# Patient Record
Sex: Female | Born: 1960 | Race: Black or African American | Hispanic: No | Marital: Single | Smoking: Current every day smoker
Health system: Southern US, Community
[De-identification: ages and names within clinical notes are randomized; demographics above are authoritative.]

## PROBLEM LIST (undated history)

## (undated) DIAGNOSIS — Z86018 Personal history of other benign neoplasm: Secondary | ICD-10-CM

## (undated) DIAGNOSIS — D649 Anemia, unspecified: Secondary | ICD-10-CM

## (undated) DIAGNOSIS — K5900 Constipation, unspecified: Secondary | ICD-10-CM

## (undated) DIAGNOSIS — J40 Bronchitis, not specified as acute or chronic: Secondary | ICD-10-CM

## (undated) DIAGNOSIS — N736 Female pelvic peritoneal adhesions (postinfective): Secondary | ICD-10-CM

## (undated) DIAGNOSIS — N83209 Unspecified ovarian cyst, unspecified side: Secondary | ICD-10-CM

## (undated) DIAGNOSIS — J329 Chronic sinusitis, unspecified: Secondary | ICD-10-CM

## (undated) HISTORY — DX: Female pelvic peritoneal adhesions (postinfective): N73.6

## (undated) HISTORY — PX: BLADDER SUSPENSION: SHX72

## (undated) HISTORY — PX: SUPRACERVICAL ABDOMINAL HYSTERECTOMY: SHX5393

---

## 2014-10-04 ENCOUNTER — Emergency Department (HOSPITAL_BASED_OUTPATIENT_CLINIC_OR_DEPARTMENT_OTHER): Payer: BLUE CROSS/BLUE SHIELD

## 2014-10-04 ENCOUNTER — Encounter (HOSPITAL_BASED_OUTPATIENT_CLINIC_OR_DEPARTMENT_OTHER): Payer: Self-pay | Admitting: *Deleted

## 2014-10-04 ENCOUNTER — Emergency Department (HOSPITAL_BASED_OUTPATIENT_CLINIC_OR_DEPARTMENT_OTHER)
Admission: EM | Admit: 2014-10-04 | Discharge: 2014-10-04 | Disposition: A | Discharge status: Home / Self Care | Payer: BLUE CROSS/BLUE SHIELD | Attending: Emergency Medicine | Admitting: Emergency Medicine

## 2014-10-04 DIAGNOSIS — Z720 Tobacco use: Secondary | ICD-10-CM | POA: Diagnosis not present

## 2014-10-04 DIAGNOSIS — J40 Bronchitis, not specified as acute or chronic: Secondary | ICD-10-CM | POA: Diagnosis not present

## 2014-10-04 DIAGNOSIS — Z880 Allergy status to penicillin: Secondary | ICD-10-CM | POA: Insufficient documentation

## 2014-10-04 DIAGNOSIS — R0602 Shortness of breath: Secondary | ICD-10-CM | POA: Diagnosis present

## 2014-10-04 HISTORY — DX: Bronchitis, not specified as acute or chronic: J40

## 2014-10-04 HISTORY — DX: Chronic sinusitis, unspecified: J32.9

## 2014-10-04 LAB — BASIC METABOLIC PANEL
Anion gap: 9 (ref 5–15)
BUN: 9 mg/dL (ref 6–23)
CHLORIDE: 108 mmol/L (ref 96–112)
CO2: 23 mmol/L (ref 19–32)
Calcium: 8.9 mg/dL (ref 8.4–10.5)
Creatinine, Ser: 0.8 mg/dL (ref 0.50–1.10)
GFR, EST NON AFRICAN AMERICAN: 82 mL/min — AB (ref >90)
Glucose, Bld: 113 mg/dL — ABNORMAL HIGH (ref 70–99)
Potassium: 3.1 mmol/L — ABNORMAL LOW (ref 3.5–5.1)
Sodium: 140 mmol/L (ref 135–145)

## 2014-10-04 LAB — CBC WITH DIFFERENTIAL/PLATELET
BASOS ABS: 0 10*3/uL (ref 0.0–0.1)
Basophils Relative: 0 % (ref 0–1)
EOS ABS: 0.2 10*3/uL (ref 0.0–0.7)
Eosinophils Relative: 2 % (ref 0–5)
HCT: 39.7 % (ref 36.0–46.0)
Hemoglobin: 14.3 g/dL (ref 12.0–15.0)
LYMPHS ABS: 2.1 10*3/uL (ref 0.7–4.0)
Lymphocytes Relative: 26 % (ref 12–46)
MCH: 30.2 pg (ref 26.0–34.0)
MCHC: 36 g/dL (ref 30.0–36.0)
MCV: 83.9 fL (ref 78.0–100.0)
MONO ABS: 0.3 10*3/uL (ref 0.1–1.0)
MONOS PCT: 4 % (ref 3–12)
NEUTROS PCT: 68 % (ref 43–77)
Neutro Abs: 5.5 10*3/uL (ref 1.7–7.7)
Platelets: 223 10*3/uL (ref 150–400)
RBC: 4.73 MIL/uL (ref 3.87–5.11)
RDW: 13.1 % (ref 11.5–15.5)
WBC: 8.1 10*3/uL (ref 4.0–10.5)

## 2014-10-04 MED ORDER — IPRATROPIUM BROMIDE 0.02 % IN SOLN
0.5000 mg | Freq: Once | RESPIRATORY_TRACT | Status: AC
  Administered 2014-10-04: 0.5 mg via RESPIRATORY_TRACT
  Filled 2014-10-04: qty 2.5

## 2014-10-04 MED ORDER — ALBUTEROL SULFATE (2.5 MG/3ML) 0.083% IN NEBU
5.0000 mg | INHALATION_SOLUTION | Freq: Once | RESPIRATORY_TRACT | Status: AC
  Administered 2014-10-04: 5 mg via RESPIRATORY_TRACT
  Filled 2014-10-04: qty 6

## 2014-10-04 MED ORDER — ALBUTEROL SULFATE HFA 108 (90 BASE) MCG/ACT IN AERS: 1.0000 | INHALATION_SPRAY | RESPIRATORY_TRACT | Status: DC | PRN

## 2014-10-04 MED ORDER — PREDNISONE 50 MG PO TABS
60.0000 mg | ORAL_TABLET | Freq: Once | ORAL | Status: AC
  Administered 2014-10-04: 60 mg via ORAL
  Filled 2014-10-04 (×2): qty 1

## 2014-10-04 MED ORDER — PREDNISONE 20 MG PO TABS: 40.0000 mg | ORAL_TABLET | Freq: Every day | ORAL | Status: DC

## 2014-10-04 MED ORDER — IPRATROPIUM-ALBUTEROL 0.5-2.5 (3) MG/3ML IN SOLN
RESPIRATORY_TRACT | Status: AC
  Administered 2014-10-04: 3 mL
  Filled 2014-10-04: qty 3

## 2014-10-04 MED ORDER — IPRATROPIUM BROMIDE 0.02 % IN SOLN: 0.5000 mg | Freq: Once | RESPIRATORY_TRACT | Status: DC

## 2014-10-04 MED ORDER — OXYCODONE-ACETAMINOPHEN 5-325 MG PO TABS
1.0000 | ORAL_TABLET | Freq: Once | ORAL | Status: AC
  Administered 2014-10-04: 1 via ORAL
  Filled 2014-10-04: qty 1

## 2014-10-04 MED ORDER — ALBUTEROL SULFATE (2.5 MG/3ML) 0.083% IN NEBU: 5.0000 mg | INHALATION_SOLUTION | Freq: Once | RESPIRATORY_TRACT | Status: DC

## 2014-10-04 MED ORDER — ALBUTEROL SULFATE (2.5 MG/3ML) 0.083% IN NEBU
INHALATION_SOLUTION | RESPIRATORY_TRACT | Status: AC
  Administered 2014-10-04: 2.5 mg
  Filled 2014-10-04: qty 3

## 2014-10-04 MED ORDER — OXYCODONE-ACETAMINOPHEN 5-325 MG PO TABS: 1.0000 | ORAL_TABLET | Freq: Four times a day (QID) | ORAL | Status: DC | PRN

## 2014-10-04 NOTE — ED Notes (Signed)
Pt arrived into ED assigned room via wheelchair, breathing into small plastic bag, bag removed, pt reassured, calm atm provided, explained nsg care being provided. Pt cooperative

## 2014-10-04 NOTE — ED Notes (Signed)
Presents w/ SOB states has been having strong coughing since this past Friday night, states chest is sore from coughing as well.

## 2014-10-04 NOTE — ED Notes (Signed)
Placed on cardiac monitor and cont POX

## 2014-10-04 NOTE — ED Notes (Signed)
HOB elevated, cont POX and cardiac monitor applied, pt reassured

## 2014-10-04 NOTE — ED Notes (Signed)
Cough still noted, strong and non-productive, pt states "I am ready to go home" explained care being provided

## 2014-10-04 NOTE — ED Notes (Signed)
Increasing sob since Friday.  This began as a URI and "the cough got so severe I couldn't breathe". No fever with this.  Pt reports some chills.  Pt reports that she has chronic bronchitis, coughing and tachypnea in triage.  Cough has been dry

## 2014-10-04 NOTE — ED Provider Notes (Signed)
CSN: [id number]     Arrival date & time 10/04/14  1317 History  This chart was scribed for Blanchie Dessert, MD by Stephania Fragmin, ED Scribe. This patient was seen in room MH03/MH03 and the patient's care was started at 3:51 PM.    Chief Complaint  Patient presents with  . Shortness of Breath   The history is provided by the patient. No language interpreter was used.   HPI Comments: Jordan Harrell is a 54 y.o. female who presents to the Emergency Department complaining of constant, severe SOB that began 2 days ago. This was following cold-like symptoms that began with a severe dry cough with associated chest soreness, a headache, and nasal congestion. No modifying factors or prior treatments were noted. Patient is a smoker and has a history of bronchitis. She denies any known fever. She has known allergies to amoxicillin.  Past Medical History  Diagnosis Date  . Bronchitis   . Sinusitis    History reviewed. No pertinent past surgical history. No family history on file. History  Substance Use Topics  . Smoking status: Current Every Day Smoker -- 0.50 packs/day  . Smokeless tobacco: Not on file  . Alcohol Use: No   OB History    No data available     Review of Systems  A complete 10 system review of systems was obtained and all systems are negative except as noted in the HPI and PMH.    Allergies  Amoxicillin  Home Medications   Prior to Admission medications   Not on File   BP 128/74 mmHg  Pulse 82  Temp(Src) 98.5 F (36.9 C) (Oral)  Resp 32  Ht 5\' 6"  (1.676 m)  Wt 167 lb (75.751 kg)  BMI 26.97 kg/m2  SpO2 98% Physical Exam  Constitutional: She is oriented to person, place, and time. She appears well-developed and well-nourished. No distress.  HENT:  Head: Normocephalic and atraumatic.  Right Ear: Tympanic membrane normal.  Left Ear: Tympanic membrane normal.  Nose: Nose normal.  Mouth/Throat: Oropharynx is clear and moist.  Eyes: Conjunctivae and EOM are normal.   Neck: Neck supple. No tracheal deviation present.  Cardiovascular: Normal rate.   Pulmonary/Chest: Effort normal. No respiratory distress. She has wheezes.  Diffuse expiratory wheezing. Tachypneic.  Musculoskeletal: Normal range of motion.  Neurological: She is alert and oriented to person, place, and time.  Skin: Skin is warm and dry.  Psychiatric: She has a normal mood and affect. Her behavior is normal.  Nursing note and vitals reviewed.   ED Course  Procedures (including critical care time)  DIAGNOSTIC STUDIES: Oxygen Saturation is 98% on room air, normal by my interpretation.    COORDINATION OF CARE: 3:52 PM - Patient made aware of negative XR results. Discussed treatment plan with pt at bedside which includes breathing treatment and steroid medication, and pt agreed to plan.   Labs Review Labs Reviewed  BASIC METABOLIC PANEL - Abnormal; Notable for the following:    Potassium 3.1 (*)    Glucose, Bld 113 (*)    GFR calc non Af Amer 82 (*)    All other components within normal limits  CBC WITH DIFFERENTIAL/PLATELET    Imaging Review Dg Chest 2 View  10/04/2014   CLINICAL DATA:  Shortness of breath, dry cough since 2 days ago  EXAM: CHEST  2 VIEW  COMPARISON:  None.  FINDINGS: The heart size and mediastinal contours are within normal limits. Both lungs are clear. Diffusely prominent interstitial reticular opacities are  noted. The visualized skeletal structures are unremarkable.  IMPRESSION: No active cardiopulmonary disease.   Electronically Signed   By: Conchita Paris M.D.   On: 10/04/2014 15:10    MDM   Final diagnoses:  Bronchitis   Pt with symptoms consistent with bronchitis with wheezing and tachypnea here.    No signs of pharyngitis, otitis or abnormal abdominal findings.   CXR wnl and labs without acute findings.  After 3 albuterol and Atrovent meds as well as prednisone and pain control patient is feeling much better. We'll discharge home with an inhaler,  prednisone and pain control.  Patient's tachypnea improved heart rate remained less than 100 and blood pressure stable.   I personally performed the services described in this documentation, which was scribed in my presence.  The recorded information has been reviewed and considered.   Blanchie Dessert, MD 10/04/14 [phone]

## 2014-10-07 NOTE — ED Notes (Signed)
Pt called in concern of still having wheezing and cough, requesting for the doctor to call in another rx. I spoke with Dr. Maryan Rued, she tx'd her on 10/04/14. Dr. Maryan Rued called in a albuterol inhaler to her drug store [facility] on [address]

## 2017-08-03 ENCOUNTER — Encounter (HOSPITAL_BASED_OUTPATIENT_CLINIC_OR_DEPARTMENT_OTHER): Payer: Self-pay | Admitting: *Deleted

## 2017-08-03 ENCOUNTER — Other Ambulatory Visit: Payer: Self-pay

## 2017-08-03 ENCOUNTER — Emergency Department (HOSPITAL_BASED_OUTPATIENT_CLINIC_OR_DEPARTMENT_OTHER)
Admission: EM | Admit: 2017-08-03 | Discharge: 2017-08-03 | Disposition: A | Discharge status: Home / Self Care | Payer: BLUE CROSS/BLUE SHIELD | Attending: Emergency Medicine | Admitting: Emergency Medicine

## 2017-08-03 DIAGNOSIS — Y9384 Activity, sleeping: Secondary | ICD-10-CM | POA: Diagnosis not present

## 2017-08-03 DIAGNOSIS — Y999 Unspecified external cause status: Secondary | ICD-10-CM | POA: Diagnosis not present

## 2017-08-03 DIAGNOSIS — X509XXA Other and unspecified overexertion or strenuous movements or postures, initial encounter: Secondary | ICD-10-CM | POA: Diagnosis not present

## 2017-08-03 DIAGNOSIS — Y92003 Bedroom of unspecified non-institutional (private) residence as the place of occurrence of the external cause: Secondary | ICD-10-CM | POA: Diagnosis not present

## 2017-08-03 DIAGNOSIS — F17200 Nicotine dependence, unspecified, uncomplicated: Secondary | ICD-10-CM | POA: Insufficient documentation

## 2017-08-03 DIAGNOSIS — S199XXA Unspecified injury of neck, initial encounter: Secondary | ICD-10-CM | POA: Diagnosis present

## 2017-08-03 DIAGNOSIS — S161XXA Strain of muscle, fascia and tendon at neck level, initial encounter: Secondary | ICD-10-CM | POA: Insufficient documentation

## 2017-08-03 MED ORDER — METHOCARBAMOL 500 MG PO TABS
500.0000 mg | ORAL_TABLET | Freq: Once | ORAL | Status: AC
  Administered 2017-08-03: 500 mg via ORAL
  Filled 2017-08-03: qty 1

## 2017-08-03 MED ORDER — METHOCARBAMOL 500 MG PO TABS: 500.0000 mg | ORAL_TABLET | Freq: Two times a day (BID) | ORAL | 0 refills | Status: DC

## 2017-08-03 MED ORDER — IBUPROFEN 800 MG PO TABS
800.0000 mg | ORAL_TABLET | Freq: Once | ORAL | Status: AC
  Administered 2017-08-03: 800 mg via ORAL
  Filled 2017-08-03: qty 1

## 2017-08-03 MED ORDER — IBUPROFEN 800 MG PO TABS: 800.0000 mg | ORAL_TABLET | Freq: Three times a day (TID) | ORAL | 0 refills | Status: DC | PRN

## 2017-08-03 NOTE — ED Provider Notes (Signed)
Emergency Department Provider Note   I have reviewed the triage vital signs and the nursing notes.   HISTORY  Chief Complaint Neck Pain   HPI Jordan Harrell is a 57 y.o. female department for evaluation of neck pain and stiffness for the past 2 weeks.  She began with right-sided lateral neck pain after waking up one morning.  She has since developed pain in her entire neck that occasionally radiates up to bilateral temples and down the back.  No numbness or weakness.  No fevers.  Denies any shaking chills.  No sick contacts.  No vision changes.  She is tried over-the-counter medications with no relief in symptoms.   Past Medical History:  Diagnosis Date  . Bronchitis   . Sinusitis     There are no active problems to display for this patient.   History reviewed. No pertinent surgical history.  Current Outpatient Rx  . Order #: [id number] Class: Print  . Order #: [id number] Class: Print  . Order #: [id number] Class: Print  . Order #: [id number] Class: Print    Allergies Amoxicillin  No family history on file.  Social History Social History   Tobacco Use  . Smoking status: Current Every Day Smoker    Packs/day: 0.50  . Smokeless tobacco: Never Used  Substance Use Topics  . Alcohol use: No  . Drug use: Not on file    Review of Systems  Constitutional: No fever/chills Eyes: No visual changes. ENT: No sore throat. Cardiovascular: Denies chest pain. Respiratory: Denies shortness of breath. Gastrointestinal: No abdominal pain.  No nausea, no vomiting.  No diarrhea.  No constipation. Genitourinary: Negative for dysuria. Musculoskeletal: Negative for back pain. Positive neck pain/stiffness.  Skin: Negative for rash. Neurological: Negative for headaches, focal weakness or numbness.  10-point ROS otherwise negative.  ____________________________________________   PHYSICAL EXAM:  VITAL SIGNS: ED Triage Vitals  Enc Vitals Group     BP 08/03/17 2028 (!)  157/86     Pulse Rate 08/03/17 2028 82     Resp 08/03/17 2028 20     Temp 08/03/17 2028 98.3 F (36.8 C)     Temp Source 08/03/17 2028 Oral     SpO2 08/03/17 2028 98 %     Weight 08/03/17 2027 177 lb (80.3 kg)     Height 08/03/17 2027 5\' 6"  (1.676 m)     Pain Score 08/03/17 2027 10   Constitutional: Alert and oriented. Well appearing and in no acute distress. Eyes: Conjunctivae are normal.  Head: Atraumatic. Nose: No congestion/rhinnorhea. Mouth/Throat: Mucous membranes are moist.  Oropharynx non-erythematous. Neck: No stridor.  No meningeal signs. Stiffness laterally with head movement R>L. No midline tenderness.  Cardiovascular: Good peripheral circulation. Respiratory: Normal respiratory effort.  Gastrointestinal:  No distention.  Musculoskeletal: No lower extremity tenderness nor edema. No gross deformities of extremities. Neurologic:  Normal speech and language. No gross focal neurologic deficits are appreciated.  Skin:  Skin is warm, dry and intact. No rash noted.  ____________________________________________  RADIOLOGY  None ____________________________________________   PROCEDURES  Procedure(s) performed:   Procedures  None ____________________________________________   INITIAL IMPRESSION / ASSESSMENT AND PLAN / ED COURSE  Pertinent labs & imaging results that were available during my care of the patient were reviewed by me and considered in my medical decision making (see chart for details).  Patient presents to the emergency department for evaluation of neck pain and stiffness for the past 2 weeks.  The patient has no meningeal signs.  No fevers or chills.  No evidence to suggest infection or bony injury.  Suspect muscle tightness and stiffness.  Plan for topical medications and Motrin during the day and Robaxin will be prescribed to take at night to help get sleep.   At this time, I do not feel there is any life-threatening condition present. I have  reviewed and discussed all results (EKG, imaging, lab, urine as appropriate), exam findings with patient. I have reviewed nursing notes and appropriate previous records.  I feel the patient is safe to be discharged home without further emergent workup. Discussed usual and customary return precautions. Patient and family (if present) verbalize understanding and are comfortable with this plan.  Patient will follow-up with their primary care provider. If they do not have a primary care provider, information for follow-up has been provided to them. All questions have been answered.  ____________________________________________  FINAL CLINICAL IMPRESSION(S) / ED DIAGNOSES  Final diagnoses:  Acute strain of neck muscle, initial encounter     MEDICATIONS GIVEN DURING THIS VISIT:  Medications  methocarbamol (ROBAXIN) tablet 500 mg (500 mg Oral Given 08/03/17 2150)  ibuprofen (ADVIL,MOTRIN) tablet 800 mg (800 mg Oral Given 08/03/17 2150)     NEW OUTPATIENT MEDICATIONS STARTED DURING THIS VISIT:  Discharge Medication List as of 08/03/2017  9:26 PM    START taking these medications   Details  ibuprofen (ADVIL,MOTRIN) 800 MG tablet Take 1 tablet (800 mg total) by mouth every 8 (eight) hours as needed., Starting Fri 08/03/2017, Print    methocarbamol (ROBAXIN) 500 MG tablet Take 1 tablet (500 mg total) by mouth 2 (two) times daily., Starting Fri 08/03/2017, Print        Note:  This document was prepared using Dragon voice recognition software and may include unintentional dictation errors.  Nanda Quinton, MD Emergency Medicine    Calen Geister, [facility], MD 08/04/17 1022

## 2017-08-03 NOTE — ED Triage Notes (Signed)
Neck pain x 2 weeks. She is able to turn her head to both sides but slowly. Pain has progressed to her right shoulder and arm. Limited ROM. She is ambulatory.

## 2017-08-03 NOTE — Discharge Instructions (Signed)

## 2017-08-03 NOTE — ED Notes (Signed)
Pt. Reports she woke 2 weeks ago with neck and shoulder pain and it has just gotten worse.  Pt. Has no visual disturbance and no chest pain.  Pt. Has been assessed by EDP Long and treated according.  Pt. Up for discharge at time RN in to see Pt.

## 2018-07-05 ENCOUNTER — Ambulatory Visit: Payer: BLUE CROSS/BLUE SHIELD | Admitting: Family Medicine

## 2018-08-09 ENCOUNTER — Ambulatory Visit: Payer: Self-pay | Admitting: Nurse Practitioner

## 2019-03-05 ENCOUNTER — Other Ambulatory Visit: Payer: Self-pay

## 2019-03-05 ENCOUNTER — Emergency Department (HOSPITAL_BASED_OUTPATIENT_CLINIC_OR_DEPARTMENT_OTHER)
Admission: EM | Admit: 2019-03-05 | Discharge: 2019-03-05 | Disposition: A | Discharge status: Home / Self Care | Payer: Managed Care, Other (non HMO) | Attending: Emergency Medicine | Admitting: Emergency Medicine

## 2019-03-05 ENCOUNTER — Encounter (HOSPITAL_BASED_OUTPATIENT_CLINIC_OR_DEPARTMENT_OTHER): Payer: Self-pay | Admitting: *Deleted

## 2019-03-05 ENCOUNTER — Emergency Department (HOSPITAL_BASED_OUTPATIENT_CLINIC_OR_DEPARTMENT_OTHER): Payer: Managed Care, Other (non HMO)

## 2019-03-05 DIAGNOSIS — Z79899 Other long term (current) drug therapy: Secondary | ICD-10-CM | POA: Insufficient documentation

## 2019-03-05 DIAGNOSIS — R1909 Other intra-abdominal and pelvic swelling, mass and lump: Secondary | ICD-10-CM | POA: Insufficient documentation

## 2019-03-05 DIAGNOSIS — F17210 Nicotine dependence, cigarettes, uncomplicated: Secondary | ICD-10-CM | POA: Diagnosis not present

## 2019-03-05 DIAGNOSIS — R140 Abdominal distension (gaseous): Secondary | ICD-10-CM | POA: Diagnosis present

## 2019-03-05 DIAGNOSIS — R1900 Intra-abdominal and pelvic swelling, mass and lump, unspecified site: Secondary | ICD-10-CM

## 2019-03-05 MED ORDER — MORPHINE SULFATE (PF) 4 MG/ML IV SOLN
4.0000 mg | Freq: Once | INTRAVENOUS | Status: AC
  Administered 2019-03-05: 4 mg via INTRAVENOUS
  Filled 2019-03-05: qty 1

## 2019-03-05 MED ORDER — HYDROCODONE-ACETAMINOPHEN 5-325 MG PO TABS
1.0000 | ORAL_TABLET | Freq: Once | ORAL | Status: AC
  Administered 2019-03-05: 1 via ORAL
  Filled 2019-03-05: qty 1

## 2019-03-05 MED ORDER — SODIUM CHLORIDE 0.9 % IV BOLUS
500.0000 mL | Freq: Once | INTRAVENOUS | Status: AC
Start: 2019-03-05 — End: 2019-03-05
  Administered 2019-03-05: 500 mL via INTRAVENOUS

## 2019-03-05 MED ORDER — IOHEXOL 300 MG/ML  SOLN
100.0000 mL | Freq: Once | INTRAMUSCULAR | Status: AC | PRN
  Administered 2019-03-05: 100 mL via INTRAVENOUS

## 2019-03-05 MED ORDER — OXYCODONE-ACETAMINOPHEN 5-325 MG PO TABS: 1.0000 | ORAL_TABLET | Freq: Four times a day (QID) | ORAL | 0 refills | Status: DC | PRN

## 2019-03-05 NOTE — ED Notes (Signed)
Patient transported to CT 

## 2019-03-05 NOTE — ED Triage Notes (Signed)
Pt c/o abd pain x 1 month , seen at [facility] today for same labs, xray and ekg done , LWBS , LAst BM 6 days ago

## 2019-03-05 NOTE — Discharge Instructions (Addendum)
If you have not heard from the gynecologist in the next 24 hours, call to make an appointment to follow-up.  If you have worsening abdominal pain, persistent vomiting or fever you need to be reevaluated.

## 2019-03-05 NOTE — ED Provider Notes (Signed)
[place] EMERGENCY DEPARTMENT Provider Note   CSN: [id number] Arrival date & time: 03/05/19  1933     History   Chief Complaint Chief Complaint  Patient presents with   Bloated    HPI Jordan Harrell is a 58 y.o. female.     HPI Patient states she is had several weeks of abdominal pain.  Has worsened over the last day.  Associated with constipation.  Denies nausea or vomiting.  No fever or chills. Patient went to [facility] emergency department and had labs performed.  She left prior to being evaluated.  Denies any urinary symptoms.  Denies obstipation. Past Medical History:  Diagnosis Date   Bronchitis    Sinusitis     There are no active problems to display for this patient.   History reviewed. No pertinent surgical history.   OB History   No obstetric history on file.      Home Medications    Prior to Admission medications   Medication Sig Start Date End Date Taking? Authorizing Provider  ibuprofen (ADVIL,MOTRIN) 800 MG tablet Take 1 tablet (800 mg total) by mouth every 8 (eight) hours as needed. 08/03/17   Long, Wonda Olds, MD  methocarbamol (ROBAXIN) 500 MG tablet Take 1 tablet (500 mg total) by mouth 2 (two) times daily. 08/03/17   Long, Wonda Olds, MD  NEXIUM 5 MG PACK DIS 1 PACKET D AND DRINK 02/25/19   [provider]  oxyCODONE-acetaminophen (PERCOCET) 5-325 MG tablet Take 1 tablet by mouth every 6 (six) hours as needed for severe pain. 03/05/19   Julianne Rice, MD  predniSONE (DELTASONE) 20 MG tablet Take 2 tablets (40 mg total) by mouth daily. 10/04/14   Blanchie Dessert, MD    Family History History reviewed. No pertinent family history.  Social History Social History   Tobacco Use   Smoking status: Current Every Day Smoker    Packs/day: 0.50   Smokeless tobacco: Never Used  Substance Use Topics   Alcohol use: No   Drug use: Not on file     Allergies   Amoxicillin   Review of Systems Review of  Systems  Constitutional: Negative for chills and fever.  Respiratory: Negative for cough and shortness of breath.   Cardiovascular: Negative for chest pain.  Gastrointestinal: Positive for abdominal distention, abdominal pain and constipation. Negative for diarrhea, nausea and vomiting.  Genitourinary: Negative for dysuria, flank pain and frequency.  Musculoskeletal: Negative for back pain, myalgias and neck pain.  Skin: Negative for rash and wound.  Neurological: Negative for dizziness, weakness, light-headedness, numbness and headaches.  All other systems reviewed and are negative.    Physical Exam Updated Vital Signs BP (!) 134/91    Pulse (!) 109    Temp 98.1 F (36.7 C)    Resp 16    Ht 5\' 5"  (1.651 m)    Wt 86.2 kg    SpO2 98%    BMI 31.62 kg/m   Physical Exam Vitals signs and nursing note reviewed.  Constitutional:      Appearance: She is well-developed.     Comments: Uncomfortable appearing  HENT:     Head: Normocephalic and atraumatic.     Nose: Nose normal.     Mouth/Throat:     Mouth: Mucous membranes are moist.  Eyes:     Pupils: Pupils are equal, round, and reactive to light.  Neck:     Musculoskeletal: Normal range of motion and neck supple.  Cardiovascular:  Rate and Rhythm: Normal rate and regular rhythm.     Heart sounds: No murmur. No friction rub. No gallop.   Pulmonary:     Effort: Pulmonary effort is normal. No respiratory distress.     Breath sounds: Normal breath sounds. No stridor. No wheezing, rhonchi or rales.  Chest:     Chest wall: No tenderness.  Abdominal:     General: There is distension.     Palpations: Abdomen is soft. There is no mass.     Tenderness: There is abdominal tenderness. There is no right CVA tenderness, left CVA tenderness, guarding or rebound.     Hernia: No hernia is present.     Comments: Diminished bowel sounds throughout.  Abdomen is distended and diffusely tender to palpation though more prominently tender in the  epigastric and left upper quadrants.  Musculoskeletal: Normal range of motion.        General: No swelling, tenderness, deformity or signs of injury.     Right lower leg: No edema.     Left lower leg: No edema.     Comments: Distal pulses are 2+.  Skin:    General: Skin is warm and dry.     Findings: No erythema or rash.  Neurological:     General: No focal deficit present.     Mental Status: She is alert and oriented to person, place, and time.     Comments: Moving all extremities without focal deficit.  Sensation intact.  Psychiatric:        Behavior: Behavior normal.      ED Treatments / Results  Labs (all labs ordered are listed, but only abnormal results are displayed) Labs Reviewed  CA 125    EKG None  Radiology Ct Abdomen Pelvis W Contrast  Addendum Date: 03/05/2019   ADDENDUM REPORT: 03/05/2019 21:01 ADDENDUM: Recommend gynecology consultation. Electronically Signed   By: Markus Daft M.D.   On: 03/05/2019 21:01   Result Date: 03/05/2019 CLINICAL DATA:  58 year old with abdominal distension and pain. EXAM: CT ABDOMEN AND PELVIS WITH CONTRAST TECHNIQUE: Multidetector CT imaging of the abdomen and pelvis was performed using the standard protocol following bolus administration of intravenous contrast. CONTRAST:  174mL OMNIPAQUE IOHEXOL 300 MG/ML  SOLN COMPARISON:  None. FINDINGS: Lower chest: Lung bases are clear. Patchy nodular densities at the breast tissue is nonspecific. Hepatobiliary: Normal appearance of the liver, gallbladder and portal venous system. No significant biliary dilatation. Pancreas: Mild dilatation of the main pancreatic duct measuring up to 4 mm. No evidence for pancreatic inflammation. Spleen: Normal in size without focal abnormality. Adrenals/Urinary Tract: Mild fullness of both adrenal glands which that may represent hyperplasia. Normal appearance of both kidneys without hydronephrosis. No suspicious renal lesions. Urinary bladder is decompressed.  Stomach/Bowel: Stomach is within normal limits. Appendix appears normal. No evidence of bowel wall thickening, distention, or inflammatory changes. Small hiatal hernia. Vascular/Lymphatic: Mildly prominent inguinal lymph nodes bilaterally. No significant abdominal or pelvic lymphadenopathy. Atherosclerotic calcifications in the abdominal aorta without aneurysm. Reproductive: There is a large mass associated with the uterus and adnexal structures. Mass has a large cystic component that extends into the abdomen. Mass appears to be contiguous with the uterus. Mass and uterus measures 20.3 x 10.8 x 21.2 cm. Cystic component measures roughly 15.2 x 10.8 x 21.2 cm. Cannot tell if this lesion originates from the uterus or left adnexa. Lesion is probably separate from the right ovary and right adnexa. There may be a separate fluid collection or cystic component  anterior to the uterus and above the urinary bladder that measures 5.1 x 2.9 x 3.1 cm. Other: Small amount of free fluid in the pelvis. No suspicious omental or peritoneal nodularity. Negative for free air. Musculoskeletal: No acute bone abnormality. Vacuum disc phenomena at L4-L5 and L5-S1. Mild facet arthropathy in the lumbar spine. IMPRESSION: 1. Large pelvic mass that extends into the abdomen. 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 Not clear if the mass originates from the left adnexa or the uterus. Lesion appears to be separate from the right adnexa. Pelvic mass may be better evaluated with an abdominopelvic MRI. In addition, there is a second cystic component in the anterior pelvic region between the uterus and urinary bladder that measures up to 5.1 cm. 2. Mild dilatation of the pancreatic duct of uncertain etiology. This area could also be evaluated with an MRI. 3. Small nodular structures in the inferior breast tissue. Recommend correlation with mammography. These results were called by telephone at the time of interpretation on  03/05/2019 at 8:52 pm to provider Teigen Bellin Lita Mains , who verbally acknowledged these results. Electronically Signed: By: Markus Daft M.D. On: 03/05/2019 20:57    Procedures Procedures (including critical care time)  Medications Ordered in ED Medications  morphine 4 MG/ML injection 4 mg (4 mg Intravenous Given 03/05/19 2008)  sodium chloride 0.9 % bolus 500 mL ( Intravenous Stopped 03/05/19 2016)  iohexol (OMNIPAQUE) 300 MG/ML solution 100 mL (100 mLs Intravenous Contrast Given 03/05/19 2019)  HYDROcodone-acetaminophen (NORCO/VICODIN) 5-325 MG per tablet 1 tablet (1 tablet Oral Given 03/05/19 2114)     Initial Impression / Assessment and Plan / ED Course  I have reviewed the triage vital signs and the nursing notes.  Pertinent labs & imaging results that were available during my care of the patient were reviewed by me and considered in my medical decision making (see chart for details).       Reviewed patient's labs performed at [facility].  Normal white blood cell count, hemoglobin, electrolytes and LFTs.  CT abdomen pelvis with large pelvic mass.  Patient is now much more comfortable after pain medication. Discussed with Dr. Harolyn Rutherford.  Recommended sending CA-125 and following up with gynecologic oncology.  She placed an ambulatory referral.  States they will contact the patient.  Patient understands that if she has not been contacted she will need to call the office to schedule the appointment.  Strict return precautions have been given.   Final Clinical Impressions(s) / ED Diagnoses   Final diagnoses:  Pelvic mass in female    ED Discharge Orders         Ordered    Ambulatory referral to Gynecologic Oncology    Comments: Abdominopelvic mass concerning for neoplasm.   03/05/19 2126    oxyCODONE-acetaminophen (PERCOCET) 5-325 MG tablet  Every 6 hours PRN     03/05/19 2129           Julianne Rice, MD 03/05/19 2132

## 2019-03-07 ENCOUNTER — Telehealth: Payer: Self-pay | Admitting: *Deleted

## 2019-03-07 LAB — CA 125: Cancer Antigen (CA) 125: 30.6 U/mL (ref 0.0–38.1)

## 2019-03-07 NOTE — Telephone Encounter (Signed)
error 

## 2019-03-10 ENCOUNTER — Telehealth: Payer: Self-pay | Admitting: *Deleted

## 2019-03-10 NOTE — Telephone Encounter (Signed)
Pt called regarding narcotic Rx running out before MD appointment and is asking for a refill.  EDCM advised that the EDP she saw is not available today and she would need to be re-examined for additional medications.  Pt also inquired about a return to work letter.  [facility] will contact ED staff to assist her.

## 2019-03-11 ENCOUNTER — Emergency Department (HOSPITAL_BASED_OUTPATIENT_CLINIC_OR_DEPARTMENT_OTHER)
Admission: EM | Admit: 2019-03-11 | Discharge: 2019-03-11 | Disposition: A | Discharge status: Home / Self Care | Payer: Managed Care, Other (non HMO) | Attending: Emergency Medicine | Admitting: Emergency Medicine

## 2019-03-11 ENCOUNTER — Other Ambulatory Visit: Payer: Self-pay

## 2019-03-11 ENCOUNTER — Encounter (HOSPITAL_BASED_OUTPATIENT_CLINIC_OR_DEPARTMENT_OTHER): Payer: Self-pay

## 2019-03-11 DIAGNOSIS — F17210 Nicotine dependence, cigarettes, uncomplicated: Secondary | ICD-10-CM | POA: Diagnosis not present

## 2019-03-11 DIAGNOSIS — Z760 Encounter for issue of repeat prescription: Secondary | ICD-10-CM | POA: Diagnosis present

## 2019-03-11 DIAGNOSIS — Z79899 Other long term (current) drug therapy: Secondary | ICD-10-CM | POA: Insufficient documentation

## 2019-03-11 DIAGNOSIS — R102 Pelvic and perineal pain: Secondary | ICD-10-CM | POA: Insufficient documentation

## 2019-03-11 MED ORDER — OXYCODONE-ACETAMINOPHEN 5-325 MG PO TABS: 1.0000 | ORAL_TABLET | Freq: Four times a day (QID) | ORAL | 0 refills | Status: AC | PRN

## 2019-03-11 MED ORDER — OXYCODONE-ACETAMINOPHEN 5-325 MG PO TABS
1.0000 | ORAL_TABLET | Freq: Once | ORAL | Status: AC
  Administered 2019-03-11: 15:00:00 1 via ORAL
  Filled 2019-03-11: qty 1

## 2019-03-11 NOTE — ED Provider Notes (Signed)
[facility] EMERGENCY DEPARTMENT Provider Note   CSN: [id number] Arrival date & time: 03/11/19  1352     History   Chief Complaint Chief Complaint  Patient presents with  . Medication Refill    HPI Jordan Harrell is a 58 y.o. female.     Patient is a 58 year old female with past medical history of bronchitis presenting to the emergency department for medication refill.  Patient was seen just a few days ago here in the emergency department and diagnosed with a palpable mass which appears cancerous.  Patient reports a lot of pelvic pain.  She has a follow-up for surgery on the mass next month.  She reports she is having a significant amount of pelvic pain without the medication and is requesting medication refill.  She does not have a primary care doctor.     Past Medical History:  Diagnosis Date  . Bronchitis   . Sinusitis     There are no active problems to display for this patient.   History reviewed. No pertinent surgical history.   OB History   No obstetric history on file.      Home Medications    Prior to Admission medications   Medication Sig Start Date End Date Taking? Authorizing Provider  ibuprofen (ADVIL,MOTRIN) 800 MG tablet Take 1 tablet (800 mg total) by mouth every 8 (eight) hours as needed. 08/03/17   Long, Wonda Olds, MD  methocarbamol (ROBAXIN) 500 MG tablet Take 1 tablet (500 mg total) by mouth 2 (two) times daily. 08/03/17   Long, Wonda Olds, MD  NEXIUM 5 MG PACK DIS 1 PACKET D AND DRINK 02/25/19   [provider]  oxyCODONE-acetaminophen (PERCOCET) 5-325 MG tablet Take 1 tablet by mouth every 6 (six) hours as needed for up to 3 days for severe pain. 03/11/19 03/14/19  Alveria Apley, PA-C  predniSONE (DELTASONE) 20 MG tablet Take 2 tablets (40 mg total) by mouth daily. 10/04/14   Blanchie Dessert, MD    Family History No family history on file.  Social History Social History   Tobacco Use  . Smoking status: Current Every  Day Smoker    Packs/day: 0.50  . Smokeless tobacco: Never Used  Substance Use Topics  . Alcohol use: No  . Drug use: Not on file     Allergies   Amoxicillin   Review of Systems Review of Systems  Constitutional: Negative for chills and fever.  Genitourinary: Positive for pelvic pain.  Musculoskeletal: Negative for arthralgias and back pain.  Neurological: Negative for dizziness and light-headedness.  All other systems reviewed and are negative.    Physical Exam Updated Vital Signs BP 125/80 (BP Location: Left Arm)   Pulse 72   Temp 98.3 F (36.8 C) (Oral)   Resp 18   Ht 5\' 5"  (1.651 m)   Wt 94.8 kg   SpO2 98%   BMI 34.78 kg/m   Physical Exam Vitals signs and nursing note reviewed.  Constitutional:      Appearance: Normal appearance.  HENT:     Head: Normocephalic.  Eyes:     Conjunctiva/sclera: Conjunctivae normal.  Pulmonary:     Effort: Pulmonary effort is normal.  Abdominal:     General: Abdomen is flat.     Tenderness: There is abdominal tenderness. There is no guarding or rebound.  Skin:    General: Skin is dry.  Neurological:     Mental Status: She is alert.  Psychiatric:  Mood and Affect: Mood normal.      ED Treatments / Results  Labs (all labs ordered are listed, but only abnormal results are displayed) Labs Reviewed - No data to display  EKG None  Radiology No results found.  Procedures Procedures (including critical care time)  Medications Ordered in ED Medications  oxyCODONE-acetaminophen (PERCOCET/ROXICET) 5-325 MG per tablet 1 tablet (has no administration in time range)     Initial Impression / Assessment and Plan / ED Course  I have reviewed the triage vital signs and the nursing notes.  Pertinent labs & imaging results that were available during my care of the patient were reviewed by me and considered in my medical decision making (see chart for details).  Clinical Course as of Mar 10 1522  Tue Mar 11, 2019   1521 Patient presenting for medication refill.  Reports she is having significant pelvic pain and was diagnosed with a pelvic mass which is due to be removed next month.  Patient advised that we do not routinely refill pain medication here in the emergency department.  However, given that she does have a legitimate reason for significant pain I told her I would refill her medications for 6 months but that we will not further refill any medication for her and she needs to follow-up with either primary care doctor or her surgeon.   [KM]    Clinical Course User Index [KM] Alveria Apley, PA-C       Based on review of vitals, medical screening exam, lab work and/or imaging, there does not appear to be an acute, emergent etiology for the patient's symptoms. Counseled pt on good return precautions and encouraged both PCP and ED follow-up as needed.  Prior to discharge, I also discussed incidental imaging findings with patient in detail and advised appropriate, recommended follow-up in detail.  Clinical Impression: No diagnosis found.  Disposition: Discharge  Prior to providing a prescription for a controlled substance, I independently reviewed the patient's recent prescription history on the [facility]. The patient had no recent or regular prescriptions and was deemed appropriate for a brief, less than 3 day prescription of narcotic for acute analgesia.  This note was prepared with assistance of Systems analyst. Occasional wrong-word or sound-a-like substitutions may have occurred due to the inherent limitations of voice recognition software.   Final Clinical Impressions(s) / ED Diagnoses   Final diagnoses:  None    ED Discharge Orders         Ordered    oxyCODONE-acetaminophen (PERCOCET) 5-325 MG tablet  Every 6 hours PRN     03/11/19 1523           Kristine Royal 03/11/19 1523    Tegeler, Gwenyth Allegra, MD  03/11/19 1530

## 2019-03-11 NOTE — ED Triage Notes (Addendum)
Pt requesting pain med refill-states she was recently seen and given "3 days of pain meds and that wasn't enough"-states she has appt with surgeon-does not have PCP to refill med-NAD-steady gait

## 2019-03-11 NOTE — ED Notes (Signed)
Instructed pt that her ride needs to be present for narcotics

## 2019-03-11 NOTE — Discharge Instructions (Addendum)
Please be aware that narcotic medication can cause dizziness, drowsiness and addiction even when taken as prescribed.  Take the medication only as prescribed and not more.  We are unable to give you any further refills of pain medication from the emergency department.  Please follow-up with a primary care doctor or your surgeon.

## 2019-03-17 ENCOUNTER — Encounter: Payer: Self-pay | Admitting: Gynecologic Oncology

## 2019-03-17 ENCOUNTER — Telehealth: Payer: Self-pay

## 2019-03-17 ENCOUNTER — Inpatient Hospital Stay (HOSPITAL_BASED_OUTPATIENT_CLINIC_OR_DEPARTMENT_OTHER): Payer: Managed Care, Other (non HMO) | Admitting: Gynecologic Oncology

## 2019-03-17 ENCOUNTER — Inpatient Hospital Stay: Payer: Managed Care, Other (non HMO) | Attending: Gynecologic Oncology

## 2019-03-17 ENCOUNTER — Other Ambulatory Visit: Payer: Self-pay

## 2019-03-17 ENCOUNTER — Other Ambulatory Visit: Payer: Self-pay | Admitting: Gynecologic Oncology

## 2019-03-17 VITALS — BP 139/77 | HR 84 | Temp 98.3°F | Resp 16 | Ht 64.5 in | Wt 210.0 lb

## 2019-03-17 DIAGNOSIS — E669 Obesity, unspecified: Secondary | ICD-10-CM

## 2019-03-17 DIAGNOSIS — F17210 Nicotine dependence, cigarettes, uncomplicated: Secondary | ICD-10-CM | POA: Insufficient documentation

## 2019-03-17 DIAGNOSIS — R102 Pelvic and perineal pain: Secondary | ICD-10-CM | POA: Diagnosis not present

## 2019-03-17 DIAGNOSIS — Z720 Tobacco use: Secondary | ICD-10-CM | POA: Insufficient documentation

## 2019-03-17 DIAGNOSIS — I10 Essential (primary) hypertension: Secondary | ICD-10-CM | POA: Insufficient documentation

## 2019-03-17 DIAGNOSIS — Z6835 Body mass index (BMI) 35.0-35.9, adult: Secondary | ICD-10-CM | POA: Diagnosis not present

## 2019-03-17 DIAGNOSIS — I252 Old myocardial infarction: Secondary | ICD-10-CM | POA: Insufficient documentation

## 2019-03-17 DIAGNOSIS — R1900 Intra-abdominal and pelvic swelling, mass and lump, unspecified site: Secondary | ICD-10-CM

## 2019-03-17 DIAGNOSIS — D3910 Neoplasm of uncertain behavior of unspecified ovary: Secondary | ICD-10-CM

## 2019-03-17 HISTORY — DX: Old myocardial infarction: I25.2

## 2019-03-17 HISTORY — DX: Intra-abdominal and pelvic swelling, mass and lump, unspecified site: R19.00

## 2019-03-17 HISTORY — DX: Obesity, unspecified: E66.9

## 2019-03-17 HISTORY — DX: Tobacco use: Z72.0

## 2019-03-17 LAB — HEMOGLOBIN A1C
Hgb A1c MFr Bld: 5.8 % — ABNORMAL HIGH (ref 4.8–5.6)
Mean Plasma Glucose: 119.76 mg/dL

## 2019-03-17 MED ORDER — SENNOSIDES-DOCUSATE SODIUM 8.6-50 MG PO TABS: 2.0000 | ORAL_TABLET | Freq: Every day | ORAL | 1 refills | Status: DC

## 2019-03-17 MED ORDER — TRAMADOL HCL 50 MG PO TABS: 50.0000 mg | ORAL_TABLET | Freq: Four times a day (QID) | ORAL | 0 refills | Status: DC | PRN

## 2019-03-17 NOTE — Telephone Encounter (Signed)
I spoke with Jordan Harrell regarding her A1c result of 5.8.  I told her that was in the pre diabetes range.  I told her Dr. Denman George recommends that she cut back on her sugar and carbohydrate intake.  Jordan. Wiebke verbalized understanding.  I asked her for the name and phone number for her PCP.  She was unable to provide at this time, as she needs to find the business card.  I told her we would call her tomorrow for that information.  She verbalized information.
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
## 2019-03-17 NOTE — Patient Instructions (Addendum)
We will need to obtain medical clearance from your primary care provider prior to surgery.  Preparing for your Surgery  Plan for surgery on April 07, 2019 with Dr. Everitt Amber at [facility] will be scheduled for a mini-laparotomy, robotic assisted bilateral salpingo-oophorectomy, possible staging.   Pre-operative Testing -You will receive a phone call from presurgical testing at [facility] if you have not received a call already to arrange for a pre-operative testing appointment before your surgery.  This appointment normally occurs one to two weeks before your scheduled surgery.   -Bring your insurance card, copy of an advanced directive if applicable, medication list  -At that visit, you will be asked to sign a consent for a possible blood transfusion in case a transfusion becomes necessary during surgery.  The need for a blood transfusion is rare but having consent is a necessary part of your care.     -You should not be taking blood thinners or aspirin at least ten days prior to surgery unless instructed by your surgeon.  -Do not take supplements such as fish oil (omega 3), red yeast rice, tumeric before your surgery.  Day Before Surgery at [facility] will be asked to take in a light diet the day before surgery.  Avoid carbonated beverages.  You will be advised to have nothing to eat or drink after midnight the evening before.    Eat a light diet the day before surgery.  Examples including soups, broths, toast, yogurt, mashed potatoes.  Things to avoid include carbonated beverages (fizzy beverages), raw fruits and raw vegetables, or beans.   If your bowels are filled with gas, your surgeon will have difficulty visualizing your pelvic organs which increases your surgical risks.  Your role in recovery Your role is to become active as soon as directed by your doctor, while still giving yourself time to heal.  Rest when you feel tired. You will be asked to do the  following in order to speed your recovery:  - Cough and breathe deeply. This helps toclear and expand your lungs and can prevent pneumonia. You may be given a spirometer to practice deep breathing. A staff member will show you how to use the spirometer. - Do mild physical activity. Walking or moving your legs help your circulation and body functions return to normal. A staff member will help you when you try to walk and will provide you with simple exercises. Do not try to get up or walk alone the first time. - Actively manage your pain. Managing your pain lets you move in comfort. We will ask you to rate your pain on a scale of zero to 10. It is your responsibility to tell your doctor or nurse where and how much you hurt so your pain can be treated.  Special Considerations -If you are diabetic, you may be placed on insulin after surgery to have closer control over your blood sugars to promote healing and recovery.  This does not mean that you will be discharged on insulin.  If applicable, your oral antidiabetics will be resumed when you are tolerating a solid diet.  -Your final pathology results from surgery should be available around one week after surgery and the results will be relayed to you when available.  -Dr. Lahoma Crocker is the surgeon that assists your GYN Oncologist with surgery.  If you end up staying the night, the next day after your surgery you will either see Dr. Denman George or Dr. Lattie Haw  [place].  -FMLA forms can be faxed to [phone] and please allow 5-7 business days for completion.  Blood Transfusion Information WHAT IS A BLOOD TRANSFUSION? A transfusion is the replacement of blood or some of its parts. Blood is made up of multiple cells which provide different functions.  Red blood cells carry oxygen and are used for blood loss replacement.  White blood cells fight against infection.  Platelets control bleeding.  Plasma helps clot blood.  Other blood products  are available for specialized needs, such as hemophilia or other clotting disorders. BEFORE THE TRANSFUSION  Who gives blood for transfusions?   You may be able to donate blood to be used at a later date on yourself (autologous donation).  Relatives can be asked to donate blood. This is generally not any safer than if you have received blood from a stranger. The same precautions are taken to ensure safety when a relative's blood is donated.  Healthy volunteers who are fully evaluated to make sure their blood is safe. This is blood bank blood. Transfusion therapy is the safest it has ever been in the practice of medicine. Before blood is taken from a donor, a complete history is taken to make sure that person has no history of diseases nor engages in risky social behavior (examples are intravenous drug use or sexual activity with multiple partners). The donor's travel history is screened to minimize risk of transmitting infections, such as malaria. The donated blood is tested for signs of infectious diseases, such as HIV and hepatitis. The blood is then tested to be sure it is compatible with you in order to minimize the chance of a transfusion reaction. If you or a relative donates blood, this is often done in anticipation of surgery and is not appropriate for emergency situations. It takes many days to process the donated blood. RISKS AND COMPLICATIONS Although transfusion therapy is very safe and saves many lives, the main dangers of transfusion include:   Getting an infectious disease.  Developing a transfusion reaction. This is an allergic reaction to something in the blood you were given. Every precaution is taken to prevent this. The decision to have a blood transfusion has been considered carefully by your caregiver before blood is given. Blood is not given unless the benefits outweigh the risks.  AFTER SURGERY INSTRUCTIONS 03/17/2019  Return to work: 4-6 weeks if  applicable  Activity: 1. Be up and out of the bed during the day.  Take a nap if needed.  You may walk up steps but be careful and use the hand rail.  Stair climbing will tire you more than you think, you may need to stop part way and rest.   2. No lifting or straining for 6 weeks.  3. No driving for 1 week(s).  Do not drive if you are taking narcotic pain medicine.  4. Shower daily.  Use soap and water on your incision and pat dry; don't rub.  No tub baths until cleared by your surgeon.   5. No sexual activity and nothing in the vagina for 2 weeks.  6. You may experience a small amount of clear drainage from your incisions, which is normal.  If the drainage persists or increases, please call the office.  7. Take Tylenol or ibuprofen first for pain and only use STRONG PAIN MEDICATION for severe pain not relieved by the Tylenol or Ibuprofen.  Monitor your Tylenol intake to a max of 4,000 mg a day.  Diet: 1. Low sodium  Heart Healthy Diet is recommended.  2. It is safe to use a laxative, such as Miralax or Colace, if you have difficulty moving your bowels. You can take Sennakot at bedtime every evening to keep bowel movements regular and to prevent constipation.    Wound Care: 1. Keep clean and dry.  Shower daily.  Reasons to call the Doctor:  Fever - Oral temperature greater than 100.4 degrees Fahrenheit  Foul-smelling vaginal discharge  Difficulty urinating  Nausea and vomiting  Increased pain at the site of the incision that is unrelieved with pain medicine.  Difficulty breathing with or without chest pain  New calf pain especially if only on one side  Sudden, continuing increased vaginal bleeding with or without clots.   Contacts: For questions or concerns you should contact:  Dr. Everitt Amber at [phone]  Joylene John, NP at [phone]  After Hours: call [phone] and have the GYN Oncologist paged/contacted
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
## 2019-03-17 NOTE — Telephone Encounter (Signed)
I spoke with [facility] staff regarding Ms. Garris's appointment for surgical clearance.  There was no appointment for Ms. Aline Brochure.

## 2019-03-18 ENCOUNTER — Telehealth: Payer: Self-pay

## 2019-03-18 NOTE — Telephone Encounter (Signed)
Left a message in Ms Frosch's vm stating the message below from Dr. Denman George as noted in [facility]  message below. Stated that she would need to stop taking any ibuprofen on 03-30-19. She can call back if she has any further questions or concerns.

## 2019-03-18 NOTE — Telephone Encounter (Signed)
Ms Jordan Harrell states that the pain in the right abdomen is a 8/10. It has been this uncomfortable since yesterday's pelvic exam.  It feels although she is still being examed. She is using 50 mg of tramadol q 6 hrs. Has 25 tablets left. Suggested that she add 500 mg of Tylenol q 6 hrs,  400 mg of ibuprofen q 8 hrs, and try a heating pad. Last BM was 03-14-19. Pt states that the pain increased after the exam yesterday not from bowels. Will give this information to Dr. Denman George to review and call her back with recommendations

## 2019-03-18 NOTE — Telephone Encounter (Signed)
Jordan John D, NP  You 16 minutes ago (2:29 PM)   Dr. Denman George said she did not do anything invasive. She did not use a speculum and did a bimanual. She is not going to get anything stronger for pain. Good with what you told her. She will need to back off of the ibuprofen closer to the surgical date (one week).   Message text

## 2019-03-19 ENCOUNTER — Telehealth: Payer: Self-pay

## 2019-03-19 NOTE — Telephone Encounter (Addendum)
With Dr. Corena Pilgrim. LM for Jordan Harrell stating that an appointment for cardiac clearance was made at [facility] center on [address] in [facility] for 03-21-19 at 3:15 pm   She needs to arrive at 3 pm to check in. Bring insurance cards. Call the office at [phone] if she has any questions.   Faxed office note from 03-17-19 and Hg A1c to Dr. Percell Miller as well as surgical clearance form. [phone] O:[phone]

## 2019-03-20 NOTE — Telephone Encounter (Signed)
Pt confirmed that she is aware of the appointment with Dr. Percell Miller tomorrow at 3:15 pm.

## 2019-03-20 NOTE — Telephone Encounter (Signed)
With Dr. Corena Pilgrim. LM for Ms Logalbo stating that an appointment for cardiac clearance was made at [facility] center on [address] in [facility] for 03-21-19 at 3:15 pm.

## 2019-03-26 ENCOUNTER — Telehealth: Payer: Self-pay

## 2019-03-26 NOTE — Telephone Encounter (Signed)
LM For Dr. Debroah Loop nurse Ripley Fraise requesting a status on the Surgical clearance form for patient's 04-07-19 surgery.

## 2019-03-26 NOTE — Telephone Encounter (Signed)
Jordan Harrell states that she is having trouble sleeping. She wanted to know if she could take a tylenol PM at bedtime.  Told her that Joylene John, NP said that a tylenol PM would be fine to use at bedtime. Pt verbalized understanding.

## 2019-03-27 NOTE — Telephone Encounter (Signed)
Received cardiac clear ence form From Dr. Percell Miller clearing Jordan Harrell for surgery on 04-07-19. A copy of the form was sent to be scanned into patient's EMR. And one to Joylene John, NP.

## 2019-04-03 ENCOUNTER — Other Ambulatory Visit (HOSPITAL_COMMUNITY)
Admission: RE | Admit: 2019-04-03 | Discharge: 2019-04-03 | Disposition: A | Discharge status: Home / Self Care | Payer: Managed Care, Other (non HMO) | Source: Ambulatory Visit | Attending: Gynecologic Oncology | Admitting: Gynecologic Oncology

## 2019-04-03 ENCOUNTER — Encounter (HOSPITAL_COMMUNITY)
Admission: RE | Admit: 2019-04-03 | Discharge: 2019-04-03 | Disposition: A | Discharge status: Home / Self Care | Payer: Managed Care, Other (non HMO) | Source: Ambulatory Visit | Attending: Gynecologic Oncology | Admitting: Gynecologic Oncology

## 2019-04-03 ENCOUNTER — Telehealth: Payer: Self-pay

## 2019-04-03 ENCOUNTER — Other Ambulatory Visit: Payer: Self-pay

## 2019-04-03 DIAGNOSIS — Z20828 Contact with and (suspected) exposure to other viral communicable diseases: Secondary | ICD-10-CM | POA: Insufficient documentation

## 2019-04-03 DIAGNOSIS — D271 Benign neoplasm of left ovary: Secondary | ICD-10-CM | POA: Diagnosis not present

## 2019-04-03 DIAGNOSIS — I10 Essential (primary) hypertension: Secondary | ICD-10-CM | POA: Diagnosis not present

## 2019-04-03 DIAGNOSIS — F17210 Nicotine dependence, cigarettes, uncomplicated: Secondary | ICD-10-CM | POA: Diagnosis not present

## 2019-04-03 DIAGNOSIS — Z6835 Body mass index (BMI) 35.0-35.9, adult: Secondary | ICD-10-CM | POA: Diagnosis not present

## 2019-04-03 DIAGNOSIS — Z01812 Encounter for preprocedural laboratory examination: Secondary | ICD-10-CM | POA: Diagnosis not present

## 2019-04-03 DIAGNOSIS — Z8249 Family history of ischemic heart disease and other diseases of the circulatory system: Secondary | ICD-10-CM | POA: Diagnosis not present

## 2019-04-03 DIAGNOSIS — E669 Obesity, unspecified: Secondary | ICD-10-CM | POA: Diagnosis not present

## 2019-04-03 DIAGNOSIS — R188 Other ascites: Secondary | ICD-10-CM | POA: Diagnosis not present

## 2019-04-03 DIAGNOSIS — Z791 Long term (current) use of non-steroidal anti-inflammatories (NSAID): Secondary | ICD-10-CM | POA: Diagnosis not present

## 2019-04-03 DIAGNOSIS — I252 Old myocardial infarction: Secondary | ICD-10-CM | POA: Diagnosis not present

## 2019-04-03 DIAGNOSIS — R1900 Intra-abdominal and pelvic swelling, mass and lump, unspecified site: Secondary | ICD-10-CM | POA: Diagnosis present

## 2019-04-03 DIAGNOSIS — Z79899 Other long term (current) drug therapy: Secondary | ICD-10-CM | POA: Diagnosis not present

## 2019-04-03 LAB — COMPREHENSIVE METABOLIC PANEL
ALT: 9 U/L (ref 0–44)
AST: 14 U/L — ABNORMAL LOW (ref 15–41)
Albumin: 3.6 g/dL (ref 3.5–5.0)
Alkaline Phosphatase: 86 U/L (ref 38–126)
Anion gap: 9 (ref 5–15)
BUN: 10 mg/dL (ref 6–20)
CO2: 25 mmol/L (ref 22–32)
Calcium: 9.1 mg/dL (ref 8.9–10.3)
Chloride: 107 mmol/L (ref 98–111)
Creatinine, Ser: 0.66 mg/dL (ref 0.44–1.00)
GFR calc Af Amer: >60 mL/min (ref >60)
GFR calc non Af Amer: >60 mL/min (ref >60)
Glucose, Bld: 97 mg/dL (ref 70–99)
Potassium: 3.7 mmol/L (ref 3.5–5.1)
Sodium: 141 mmol/L (ref 135–145)
Total Bilirubin: 0.2 mg/dL — ABNORMAL LOW (ref 0.3–1.2)
Total Protein: 7.5 g/dL (ref 6.5–8.1)

## 2019-04-03 LAB — URINALYSIS, ROUTINE W REFLEX MICROSCOPIC
Bilirubin Urine: NEGATIVE
Glucose, UA: NEGATIVE mg/dL
Hgb urine dipstick: NEGATIVE
Ketones, ur: NEGATIVE mg/dL
Leukocytes,Ua: NEGATIVE
Nitrite: NEGATIVE
Protein, ur: 30 mg/dL — AB
Specific Gravity, Urine: 1.028 (ref 1.005–1.030)
pH: 5 (ref 5.0–8.0)

## 2019-04-03 LAB — CBC
HCT: 37.4 % (ref 36.0–46.0)
Hemoglobin: 12 g/dL (ref 12.0–15.0)
MCH: 27.6 pg (ref 26.0–34.0)
MCHC: 32.1 g/dL (ref 30.0–36.0)
MCV: 86 fL (ref 80.0–100.0)
Platelets: 308 10*3/uL (ref 150–400)
RBC: 4.35 MIL/uL (ref 3.87–5.11)
RDW: 13.6 % (ref 11.5–15.5)
WBC: 5.9 10*3/uL (ref 4.0–10.5)
nRBC: 0 % (ref 0.0–0.2)

## 2019-04-03 LAB — ABO/RH: ABO/RH(D): AB POS

## 2019-04-03 NOTE — Telephone Encounter (Signed)
Jordan Harrell wanted to know why her disability forms have her out of work  from 04-03-19 through 05-19-19 when she was seen in the office on 03-17-19.  She has not been able to work since 03-05-19 due to pain from mass.  LM explaining that it is the physician's policy to have disability begin from the date of the covid test through last day of recuperation from surgery.  Spoke with [name] at her [employer] and she stated that she sent and e-mail to her  That gave information stating that she could possibly get the ED notes from 03-05-19 and 03-11-19 to be sent in to cover disability from 03-05-19 through 04-03-19.

## 2019-04-04 ENCOUNTER — Telehealth: Payer: Self-pay

## 2019-04-04 ENCOUNTER — Other Ambulatory Visit: Payer: Self-pay

## 2019-04-04 ENCOUNTER — Encounter (HOSPITAL_BASED_OUTPATIENT_CLINIC_OR_DEPARTMENT_OTHER): Payer: Self-pay | Admitting: *Deleted

## 2019-04-04 LAB — CA 125: Cancer Antigen (CA) 125: 25.1 U/mL (ref 0.0–38.1)

## 2019-04-04 NOTE — Progress Notes (Addendum)
Spoke w/ via phone for pre-op interview--- PT  Lab results------ dated 04-03-2019 CBC, CMP, T&S, CA 125, UA in epic/ chart COVID test ------ 04-03-2019  Arrive at ------- 0600 NPO after ------ MN w/ exception clear liquids until 0500 then nothing by mouth Medications to take morning of surgery ----- NONE Diabetic medication ----- n/a  Patient Special Instructions ----- pt to start light diet day before surgery and avoid raw vegetables/ fruit , beans, and carbonated drinks. Pre-Op special Istructions ----- pt has pcp clearance dated 03-27-2019, dr Corena Pilgrim [facility] in [place]), in chart received via fax from dr Denman George office.  Patient verbalized understanding of instructions that were given at this phone interview. Patient denies shortness of breath, chest pain, fever, cough a this phone interview.

## 2019-04-04 NOTE — Telephone Encounter (Signed)
2 messages left for pt. No return call.

## 2019-04-06 LAB — NOVEL CORONAVIRUS, NAA (HOSP ORDER, SEND-OUT TO REF LAB; TAT 18-24 HRS): SARS-CoV-2, NAA: NOT DETECTED

## 2019-04-07 ENCOUNTER — Encounter (HOSPITAL_BASED_OUTPATIENT_CLINIC_OR_DEPARTMENT_OTHER): Payer: Self-pay | Admitting: Certified Registered"

## 2019-04-07 ENCOUNTER — Encounter (HOSPITAL_BASED_OUTPATIENT_CLINIC_OR_DEPARTMENT_OTHER)
Admission: RE | Disposition: A | Discharge status: Home / Self Care | Payer: Self-pay | Source: Home / Self Care | Attending: Gynecologic Oncology

## 2019-04-07 ENCOUNTER — Other Ambulatory Visit: Payer: Self-pay

## 2019-04-07 ENCOUNTER — Ambulatory Visit (HOSPITAL_BASED_OUTPATIENT_CLINIC_OR_DEPARTMENT_OTHER)
Admission: RE | Admit: 2019-04-07 | Discharge: 2019-04-07 | Disposition: A | Discharge status: Home / Self Care | Payer: Managed Care, Other (non HMO) | Attending: Gynecologic Oncology | Admitting: Gynecologic Oncology

## 2019-04-07 ENCOUNTER — Ambulatory Visit (HOSPITAL_BASED_OUTPATIENT_CLINIC_OR_DEPARTMENT_OTHER): Payer: Managed Care, Other (non HMO) | Admitting: Anesthesiology

## 2019-04-07 DIAGNOSIS — L0232 Furuncle of buttock: Secondary | ICD-10-CM | POA: Diagnosis present

## 2019-04-07 DIAGNOSIS — N736 Female pelvic peritoneal adhesions (postinfective): Secondary | ICD-10-CM

## 2019-04-07 DIAGNOSIS — Z8249 Family history of ischemic heart disease and other diseases of the circulatory system: Secondary | ICD-10-CM | POA: Insufficient documentation

## 2019-04-07 DIAGNOSIS — D271 Benign neoplasm of left ovary: Secondary | ICD-10-CM | POA: Insufficient documentation

## 2019-04-07 DIAGNOSIS — I252 Old myocardial infarction: Secondary | ICD-10-CM | POA: Insufficient documentation

## 2019-04-07 DIAGNOSIS — R188 Other ascites: Secondary | ICD-10-CM | POA: Insufficient documentation

## 2019-04-07 DIAGNOSIS — R1900 Intra-abdominal and pelvic swelling, mass and lump, unspecified site: Secondary | ICD-10-CM | POA: Diagnosis present

## 2019-04-07 DIAGNOSIS — E669 Obesity, unspecified: Secondary | ICD-10-CM | POA: Diagnosis present

## 2019-04-07 DIAGNOSIS — Z6835 Body mass index (BMI) 35.0-35.9, adult: Secondary | ICD-10-CM | POA: Insufficient documentation

## 2019-04-07 DIAGNOSIS — F17210 Nicotine dependence, cigarettes, uncomplicated: Secondary | ICD-10-CM | POA: Insufficient documentation

## 2019-04-07 DIAGNOSIS — I10 Essential (primary) hypertension: Secondary | ICD-10-CM | POA: Insufficient documentation

## 2019-04-07 DIAGNOSIS — Z791 Long term (current) use of non-steroidal anti-inflammatories (NSAID): Secondary | ICD-10-CM | POA: Insufficient documentation

## 2019-04-07 DIAGNOSIS — Z79899 Other long term (current) drug therapy: Secondary | ICD-10-CM | POA: Insufficient documentation

## 2019-04-07 HISTORY — DX: Personal history of other benign neoplasm: Z86.018

## 2019-04-07 HISTORY — DX: Constipation, unspecified: K59.00

## 2019-04-07 HISTORY — DX: Unspecified ovarian cyst, unspecified side: N83.209

## 2019-04-07 HISTORY — DX: Anemia, unspecified: D64.9

## 2019-04-07 HISTORY — PX: ROBOTIC ASSISTED BILATERAL SALPINGO OOPHERECTOMY: SHX6078

## 2019-04-07 HISTORY — PX: LAPAROTOMY: SHX154

## 2019-04-07 HISTORY — DX: Furuncle of buttock: L02.32

## 2019-04-07 LAB — TYPE AND SCREEN
ABO/RH(D): AB POS
Antibody Screen: NEGATIVE

## 2019-04-07 SURGERY — LAPAROTOMY
Anesthesia: General | Site: Abdomen

## 2019-04-07 MED ORDER — CIPROFLOXACIN IN D5W 400 MG/200ML IV SOLN
INTRAVENOUS | Status: AC
  Filled 2019-04-07: qty 200

## 2019-04-07 MED ORDER — ENOXAPARIN SODIUM 40 MG/0.4ML ~~LOC~~ SOLN
40.0000 mg | SUBCUTANEOUS | Status: AC
  Administered 2019-04-07: 07:00:00 40 mg via SUBCUTANEOUS
  Filled 2019-04-07: qty 0.4

## 2019-04-07 MED ORDER — ENOXAPARIN SODIUM 40 MG/0.4ML ~~LOC~~ SOLN
SUBCUTANEOUS | Status: AC
  Filled 2019-04-07: qty 0.4

## 2019-04-07 MED ORDER — PHENYLEPHRINE 40 MCG/ML (10ML) SYRINGE FOR IV PUSH (FOR BLOOD PRESSURE SUPPORT)
PREFILLED_SYRINGE | INTRAVENOUS | Status: AC
  Filled 2019-04-07: qty 10

## 2019-04-07 MED ORDER — FENTANYL CITRATE (PF) 100 MCG/2ML IJ SOLN
INTRAMUSCULAR | Status: DC | PRN
  Administered 2019-04-07 (×9): 50 ug via INTRAVENOUS

## 2019-04-07 MED ORDER — OXYCODONE HCL 5 MG PO TABS
5.0000 mg | ORAL_TABLET | Freq: Once | ORAL | Status: DC | PRN
  Filled 2019-04-07: qty 1

## 2019-04-07 MED ORDER — CIPROFLOXACIN IN D5W 400 MG/200ML IV SOLN
400.0000 mg | INTRAVENOUS | Status: AC
  Administered 2019-04-07: 08:00:00 400 mg via INTRAVENOUS
  Filled 2019-04-07: qty 200

## 2019-04-07 MED ORDER — SCOPOLAMINE 1 MG/3DAYS TD PT72
1.0000 | MEDICATED_PATCH | TRANSDERMAL | Status: DC
  Administered 2019-04-07: 08:00:00 1.5 mg via TRANSDERMAL
  Filled 2019-04-07: qty 1

## 2019-04-07 MED ORDER — ONDANSETRON HCL 4 MG/2ML IJ SOLN
4.0000 mg | Freq: Once | INTRAMUSCULAR | Status: DC | PRN
  Filled 2019-04-07: qty 2

## 2019-04-07 MED ORDER — CLINDAMYCIN PHOSPHATE 900 MG/50ML IV SOLN
900.0000 mg | INTRAVENOUS | Status: AC
  Administered 2019-04-07: 900 mg via INTRAVENOUS
  Filled 2019-04-07: qty 50

## 2019-04-07 MED ORDER — DEXAMETHASONE SODIUM PHOSPHATE 10 MG/ML IJ SOLN
INTRAMUSCULAR | Status: AC
  Filled 2019-04-07: qty 1

## 2019-04-07 MED ORDER — FENTANYL CITRATE (PF) 100 MCG/2ML IJ SOLN
25.0000 ug | INTRAMUSCULAR | Status: DC | PRN
  Filled 2019-04-07: qty 1

## 2019-04-07 MED ORDER — FENTANYL CITRATE (PF) 100 MCG/2ML IJ SOLN
INTRAMUSCULAR | Status: AC
  Filled 2019-04-07: qty 2

## 2019-04-07 MED ORDER — CLINDAMYCIN PHOSPHATE 900 MG/50ML IV SOLN
INTRAVENOUS | Status: AC
  Filled 2019-04-07: qty 50

## 2019-04-07 MED ORDER — SUGAMMADEX SODIUM 200 MG/2ML IV SOLN
INTRAVENOUS | Status: DC | PRN
  Administered 2019-04-07: 200 mg via INTRAVENOUS

## 2019-04-07 MED ORDER — IPRATROPIUM-ALBUTEROL 0.5-2.5 (3) MG/3ML IN SOLN
3.0000 mL | Freq: Once | RESPIRATORY_TRACT | Status: AC
  Administered 2019-04-07: 3 mL via RESPIRATORY_TRACT
  Filled 2019-04-07: qty 3

## 2019-04-07 MED ORDER — HYDROMORPHONE HCL 2 MG/ML IJ SOLN
INTRAMUSCULAR | Status: AC
  Filled 2019-04-07: qty 1

## 2019-04-07 MED ORDER — ROCURONIUM BROMIDE 10 MG/ML (PF) SYRINGE
PREFILLED_SYRINGE | INTRAVENOUS | Status: DC | PRN
  Administered 2019-04-07: 70 mg via INTRAVENOUS
  Administered 2019-04-07: 10 mg via INTRAVENOUS
  Administered 2019-04-07: 15 mg via INTRAVENOUS

## 2019-04-07 MED ORDER — LIDOCAINE 2% (20 MG/ML) 5 ML SYRINGE
INTRAMUSCULAR | Status: DC | PRN
  Administered 2019-04-07: 20 mg via INTRAVENOUS
  Administered 2019-04-07: 40 mg via INTRAVENOUS

## 2019-04-07 MED ORDER — DEXAMETHASONE SODIUM PHOSPHATE 10 MG/ML IJ SOLN
INTRAMUSCULAR | Status: DC | PRN
  Administered 2019-04-07: 4 mg via INTRAVENOUS

## 2019-04-07 MED ORDER — SODIUM CHLORIDE 0.9 % IR SOLN
Status: DC | PRN
  Administered 2019-04-07: 1000 mL

## 2019-04-07 MED ORDER — MIDAZOLAM HCL 2 MG/2ML IJ SOLN
INTRAMUSCULAR | Status: AC
  Filled 2019-04-07: qty 2

## 2019-04-07 MED ORDER — GABAPENTIN 300 MG PO CAPS
300.0000 mg | ORAL_CAPSULE | ORAL | Status: AC
  Administered 2019-04-07: 07:00:00 300 mg via ORAL
  Filled 2019-04-07: qty 1

## 2019-04-07 MED ORDER — FENTANYL CITRATE (PF) 250 MCG/5ML IJ SOLN
INTRAMUSCULAR | Status: AC
  Filled 2019-04-07: qty 5

## 2019-04-07 MED ORDER — PROPOFOL 10 MG/ML IV BOLUS
INTRAVENOUS | Status: DC | PRN
  Administered 2019-04-07: 30 mg via INTRAVENOUS
  Administered 2019-04-07: 170 mg via INTRAVENOUS

## 2019-04-07 MED ORDER — HYDROMORPHONE HCL 1 MG/ML IJ SOLN
INTRAMUSCULAR | Status: DC | PRN
  Administered 2019-04-07: 1 mg via INTRAVENOUS
  Administered 2019-04-07: 0.5 mg via INTRAVENOUS

## 2019-04-07 MED ORDER — BUPIVACAINE LIPOSOME 1.3 % IJ SUSP
INTRAMUSCULAR | Status: DC | PRN
  Administered 2019-04-07: 20 mL

## 2019-04-07 MED ORDER — PHENYLEPHRINE 40 MCG/ML (10ML) SYRINGE FOR IV PUSH (FOR BLOOD PRESSURE SUPPORT)
PREFILLED_SYRINGE | INTRAVENOUS | Status: DC | PRN
  Administered 2019-04-07: 80 ug via INTRAVENOUS

## 2019-04-07 MED ORDER — KETOROLAC TROMETHAMINE 30 MG/ML IJ SOLN
30.0000 mg | Freq: Once | INTRAMUSCULAR | Status: AC
  Administered 2019-04-07: 30 mg via INTRAVENOUS
  Filled 2019-04-07: qty 1

## 2019-04-07 MED ORDER — DEXAMETHASONE SODIUM PHOSPHATE 4 MG/ML IJ SOLN
4.0000 mg | INTRAMUSCULAR | Status: DC
  Filled 2019-04-07: qty 1

## 2019-04-07 MED ORDER — KETOROLAC TROMETHAMINE 30 MG/ML IJ SOLN
INTRAMUSCULAR | Status: AC
  Filled 2019-04-07: qty 1

## 2019-04-07 MED ORDER — CELECOXIB 200 MG PO CAPS
ORAL_CAPSULE | ORAL | Status: AC
  Filled 2019-04-07: qty 2

## 2019-04-07 MED ORDER — BUPIVACAINE HCL 0.25 % IJ SOLN
INTRAMUSCULAR | Status: DC | PRN
  Administered 2019-04-07: 30 mL

## 2019-04-07 MED ORDER — ROCURONIUM BROMIDE 10 MG/ML (PF) SYRINGE
PREFILLED_SYRINGE | INTRAVENOUS | Status: AC
  Filled 2019-04-07: qty 10

## 2019-04-07 MED ORDER — IBUPROFEN 800 MG PO TABS: 800.0000 mg | ORAL_TABLET | Freq: Three times a day (TID) | ORAL | 1 refills | Status: DC | PRN

## 2019-04-07 MED ORDER — ONDANSETRON HCL 4 MG/2ML IJ SOLN
INTRAMUSCULAR | Status: AC
  Filled 2019-04-07: qty 2

## 2019-04-07 MED ORDER — SCOPOLAMINE 1 MG/3DAYS TD PT72
MEDICATED_PATCH | TRANSDERMAL | Status: AC
  Filled 2019-04-07: qty 1

## 2019-04-07 MED ORDER — ACETAMINOPHEN 500 MG PO TABS
ORAL_TABLET | ORAL | Status: AC
  Filled 2019-04-07: qty 2

## 2019-04-07 MED ORDER — LIDOCAINE 2% (20 MG/ML) 5 ML SYRINGE
INTRAMUSCULAR | Status: AC
  Filled 2019-04-07: qty 5

## 2019-04-07 MED ORDER — LACTATED RINGERS IV SOLN
INTRAVENOUS | Status: DC
  Administered 2019-04-07: 1000 mL via INTRAVENOUS
  Administered 2019-04-07 (×2): via INTRAVENOUS
  Filled 2019-04-07: qty 1000

## 2019-04-07 MED ORDER — ALBUTEROL SULFATE HFA 108 (90 BASE) MCG/ACT IN AERS
INHALATION_SPRAY | RESPIRATORY_TRACT | Status: DC | PRN
  Administered 2019-04-07: 5 via RESPIRATORY_TRACT

## 2019-04-07 MED ORDER — MIDAZOLAM HCL 5 MG/5ML IJ SOLN
INTRAMUSCULAR | Status: DC | PRN
  Administered 2019-04-07: 2 mg via INTRAVENOUS

## 2019-04-07 MED ORDER — CELECOXIB 400 MG PO CAPS
400.0000 mg | ORAL_CAPSULE | ORAL | Status: AC
  Administered 2019-04-07: 400 mg via ORAL
  Filled 2019-04-07: qty 1

## 2019-04-07 MED ORDER — OXYCODONE HCL 10 MG PO TABS: 10.0000 mg | ORAL_TABLET | ORAL | 0 refills | Status: DC | PRN

## 2019-04-07 MED ORDER — ACETAMINOPHEN 500 MG PO TABS
1000.0000 mg | ORAL_TABLET | ORAL | Status: AC
  Administered 2019-04-07: 07:00:00 1000 mg via ORAL
  Filled 2019-04-07: qty 2

## 2019-04-07 MED ORDER — GABAPENTIN 300 MG PO CAPS
ORAL_CAPSULE | ORAL | Status: AC
  Filled 2019-04-07: qty 1

## 2019-04-07 MED ORDER — OXYCODONE HCL 5 MG/5ML PO SOLN
5.0000 mg | Freq: Once | ORAL | Status: DC | PRN
  Filled 2019-04-07: qty 5

## 2019-04-07 MED ORDER — PROPOFOL 10 MG/ML IV BOLUS
INTRAVENOUS | Status: AC
  Filled 2019-04-07: qty 20

## 2019-04-07 MED ORDER — ONDANSETRON HCL 4 MG/2ML IJ SOLN
INTRAMUSCULAR | Status: DC | PRN
  Administered 2019-04-07: 4 mg via INTRAVENOUS

## 2019-04-07 MED ORDER — ALBUTEROL SULFATE HFA 108 (90 BASE) MCG/ACT IN AERS
INHALATION_SPRAY | RESPIRATORY_TRACT | Status: AC
  Filled 2019-04-07: qty 6.7

## 2019-04-07 SURGICAL SUPPLY — 65 items
APPLICATOR SURGIFLO ENDO (HEMOSTASIS) ×3 IMPLANT
BAG LAPAROSCOPIC 12 15 PORT 16 (BASKET) IMPLANT
BAG RETRIEVAL 12/15 (BASKET)
BLADE SURG 10 STRL SS (BLADE) ×3 IMPLANT
COVER BACK TABLE 60X90IN (DRAPES) ×3 IMPLANT
COVER TIP SHEARS 8 DVNC (MISCELLANEOUS) ×4 IMPLANT
COVER TIP SHEARS 8MM DA VINCI (MISCELLANEOUS) ×2
COVER WAND RF STERILE (DRAPES) ×3 IMPLANT
DERMABOND ADVANCED (GAUZE/BANDAGES/DRESSINGS) ×1
DERMABOND ADVANCED .7 DNX12 (GAUZE/BANDAGES/DRESSINGS) ×2 IMPLANT
DRAPE ARM DVNC X/XI (DISPOSABLE) ×8 IMPLANT
DRAPE COLUMN DVNC XI (DISPOSABLE) ×2 IMPLANT
DRAPE DA VINCI XI ARM (DISPOSABLE) ×4
DRAPE DA VINCI XI COLUMN (DISPOSABLE) ×1
DRAPE SHEET LG 3/4 BI-LAMINATE (DRAPES) ×3 IMPLANT
DRAPE SURG IRRIG POUCH 19X23 (DRAPES) ×3 IMPLANT
DRSG OPSITE POSTOP 3X4 (GAUZE/BANDAGES/DRESSINGS) ×3 IMPLANT
ELECT REM PT RETURN 9FT ADLT (ELECTROSURGICAL) ×3
ELECTRODE REM PT RTRN 9FT ADLT (ELECTROSURGICAL) ×2 IMPLANT
GAUZE 4X4 16PLY RFD (DISPOSABLE) ×3 IMPLANT
GLOVE BIO SURGEON STRL SZ 6 (GLOVE) ×12 IMPLANT
GLOVE BIO SURGEON STRL SZ 6.5 (GLOVE) IMPLANT
GOWN STRL REUS W/TWL LRG LVL3 (GOWN DISPOSABLE) ×6 IMPLANT
HOLDER FOLEY CATH W/STRAP (MISCELLANEOUS) IMPLANT
IRRIG SUCT STRYKERFLOW 2 WTIP (MISCELLANEOUS) ×3
IRRIGATION SUCT STRKRFLW 2 WTP (MISCELLANEOUS) ×2 IMPLANT
KIT PROCEDURE DA VINCI SI (MISCELLANEOUS)
KIT PROCEDURE DVNC SI (MISCELLANEOUS) IMPLANT
KIT TURNOVER CYSTO (KITS) IMPLANT
LEGGING LITHOTOMY PAIR STRL (DRAPES) ×3 IMPLANT
MANIPULATOR UTERINE 4.5 ZUMI (MISCELLANEOUS) ×3 IMPLANT
NEEDLE HYPO 22GX1.5 SAFETY (NEEDLE) ×3 IMPLANT
NEEDLE SPNL 18GX3.5 QUINCKE PK (NEEDLE) IMPLANT
OBTURATOR OPTICAL STANDARD 8MM (TROCAR) ×1
OBTURATOR OPTICAL STND 8 DVNC (TROCAR) ×2
OBTURATOR OPTICALSTD 8 DVNC (TROCAR) ×2 IMPLANT
PACK ROBOT GYN CUSTOM WL (TRAY / TRAY PROCEDURE) ×3 IMPLANT
PAD POSITIONING PINK XL (MISCELLANEOUS) ×3 IMPLANT
PENCIL BUTTON HOLSTER BLD 10FT (ELECTRODE) ×3 IMPLANT
PORT ACCESS TROCAR AIRSEAL 12 (TROCAR) ×2 IMPLANT
PORT ACCESS TROCAR AIRSEAL 5M (TROCAR) ×1
POUCH SPECIMEN RETRIEVAL 10MM (ENDOMECHANICALS) ×3 IMPLANT
SEAL CANN UNIV 5-8 DVNC XI (MISCELLANEOUS) ×6 IMPLANT
SEAL XI 5MM-8MM UNIVERSAL (MISCELLANEOUS) ×3
SET TRI-LUMEN FLTR TB AIRSEAL (TUBING) ×3 IMPLANT
SPONGE LAP 18X18 RF (DISPOSABLE) ×3 IMPLANT
SPONGE LAP 4X18 RFD (DISPOSABLE) ×3 IMPLANT
SURGIFLO W/THROMBIN 8M KIT (HEMOSTASIS) ×9 IMPLANT
SUT MNCRL AB 4-0 PS2 18 (SUTURE) ×3 IMPLANT
SUT PDS AB 0 CTX 60 (SUTURE) ×9 IMPLANT
SUT VIC AB 0 CT1 36 (SUTURE) IMPLANT
SUT VIC AB 2-0 CT1 (SUTURE) ×6 IMPLANT
SUT VIC AB 3-0 SH 27 (SUTURE) ×6
SUT VIC AB 3-0 SH 27X BRD (SUTURE) IMPLANT
SUT VIC AB 3-0 SH 27XBRD (SUTURE) ×12 IMPLANT
SUT VIC AB 4-0 PS2 18 (SUTURE) ×6 IMPLANT
SYR 10ML LL (SYRINGE) IMPLANT
SYR BULB IRRIGATION 50ML (SYRINGE) ×3 IMPLANT
TRAP SPECIMEN MUCOUS 40CC (MISCELLANEOUS) IMPLANT
TRAY FOLEY W/BAG SLVR 14FR (SET/KITS/TRAYS/PACK) ×3 IMPLANT
TROCAR XCEL 12X100 BLDLESS (ENDOMECHANICALS) ×3 IMPLANT
TUBE CONNECTING 12X1/4 (SUCTIONS) ×3 IMPLANT
UNDERPAD 30X30 (UNDERPADS AND DIAPERS) ×3 IMPLANT
WATER STERILE IRR 1000ML POUR (IV SOLUTION) ×3 IMPLANT
YANKAUER SUCT BULB TIP NO VENT (SUCTIONS) IMPLANT

## 2019-04-07 NOTE — Anesthesia Preprocedure Evaluation (Addendum)
Anesthesia Evaluation  Patient identified by MRN, date of birth, ID band Patient awake    Reviewed: Allergy & Precautions, NPO status , Patient's Chart, lab work & pertinent test results  Airway Mallampati: II  TM Distance: >3 FB Neck ROM: Full    Dental  (+) Teeth Intact, Dental Advisory Given   Pulmonary Current Smoker,    breath sounds clear to auscultation       Cardiovascular  Rhythm:Regular Rate:Normal     Neuro/Psych    GI/Hepatic   Endo/Other    Renal/GU      Musculoskeletal   Abdominal (+) + obese,   Peds  Hematology   Anesthesia Other Findings Induration mild swelling left and right buttocks at gluteal crease.  Reproductive/Obstetrics                             Anesthesia Physical Anesthesia Plan  ASA: III  Anesthesia Plan: General   Post-op Pain Management:    Induction: Intravenous  PONV Risk Score and Plan: Ondansetron and Dexamethasone  Airway Management Planned: Oral ETT  Additional Equipment:   Intra-op Plan:   Post-operative Plan: Extubation in OR  Informed Consent: I have reviewed the patients History and Physical, chart, labs and discussed the procedure including the risks, benefits and alternatives for the proposed anesthesia with the patient or authorized representative who has indicated his/her understanding and acceptance.     Dental advisory given  Plan Discussed with: CRNA and Anesthesiologist  Anesthesia Plan Comments:         Anesthesia Quick Evaluation

## 2019-04-07 NOTE — Anesthesia Postprocedure Evaluation (Signed)
Anesthesia Post Note  Patient: Jordan Harrell  Procedure(s) Performed: MINI-LAPAROTOMY (N/A Abdomen) XI ROBOTIC ASSISTED BILATERAL LAPAROSCOPIC  SALPINGO OOPHORECTOMY WITH LYSIS OF ADHESIONS (N/A Abdomen)     Patient location during evaluation: PACU Anesthesia Type: General Level of consciousness: awake and alert Pain management: pain level controlled Vital Signs Assessment: post-procedure vital signs reviewed and stable Respiratory status: spontaneous breathing, nonlabored ventilation, respiratory function stable and patient connected to nasal cannula oxygen Cardiovascular status: blood pressure returned to baseline and stable Postop Assessment: no apparent nausea or vomiting Anesthetic complications: no    Last Vitals:  Vitals:   04/07/19 1230 04/07/19 1245  BP: (!) 166/95 (!) 154/89  Pulse: (!) 58 67  Resp: (!) 24 (!) 24  Temp:    SpO2: 100% 100%    Last Pain:  Vitals:   04/07/19 1330  TempSrc:   PainSc: 0-No pain                 Tiajuana Amass

## 2019-04-07 NOTE — Discharge Instructions (Addendum)
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 It was completely removed along with the right tube and ovary.   Return to work: 4 weeks   Activity: 1. Be up and out of the bed during the day.  Take a nap if needed.  You may walk up steps but be careful and use the hand rail.  Stair climbing will tire you more than you think, you may need to stop part way and rest.   2. No lifting or straining for 6 weeks.  3. No driving for 2 weeks.  Do Not drive if you are taking narcotic pain medicine.  4. Shower daily.  Use soap and water on your incision and pat dry; don't rub.   5. No sexual activity and nothing in the vagina for 1 weeks.  Medications:  - Take ibuprofen and tylenol first line for pain control. Take these regularly (every 6 hours) to decrease the build up of pain.  - If necessary, for severe pain not relieved by ibuprofen, take oxycodone.  - While taking oxycodone you should take sennakot every night to reduce the likelihood of constipation. If this causes diarrhea, stop its use.  - Dr Denman George will not prescribe refills on your postoperative opioid pain medications (oxycodone) in compliance with state regulations.   Diet: 1. Low sodium Heart Healthy Diet is recommended.  2. It is safe to use a laxative if you have difficulty moving your bowels.   Wound Care: 1. Keep clean and dry.  Shower daily. 2. You can get the dressing wet in the shower, but avoid tub baths. 3. Remove the dressing between 5 and 7 days after your surgery (1 week after your operation). 4. After the dressing is removed you can get the incision wet in the shower, however continue to avoid tub baths until advised otherwise by your surgeon at follow-up.    Reasons to call the Doctor:   Fever - Oral temperature greater than 100.4 degrees Fahrenheit  Foul-smelling vaginal discharge  Difficulty  urinating  Nausea and vomiting  Increased pain at the site of the incision that is unrelieved with pain medicine.  Difficulty breathing with or without chest pain  New calf pain especially if only on one side  Sudden, continuing increased vaginal bleeding with or without clots.   Follow-up: 1. See Everitt Amber in 3 weeks.  Contacts: For questions or concerns you should contact:  Dr. Everitt Amber at [phone] After hours and on week-ends call [phone] and ask to speak to the physician on call for Gynecologic Oncology    Post Anesthesia Home Care Instructions  Activity: Get plenty of rest for the remainder of the day. A responsible individual must stay with you for 24 hours following the procedure.  For the next 24 hours, DO NOT: -Drive a car -Paediatric nurse -Drink alcoholic beverages -Take any medication unless instructed by your physician -Make any legal decisions or sign important papers.  Meals: Start with liquid foods such as gelatin or soup. Progress to regular foods as tolerated. Avoid greasy, spicy, heavy foods. If nausea and/or vomiting occur, drink only clear liquids until the nausea and/or vomiting subsides. Call your physician if vomiting continues.  Special Instructions/Symptoms: Your throat may feel dry or sore from the anesthesia or the breathing tube placed in your throat during surgery. If this causes discomfort, gargle with warm salt water. The  discomfort should disappear within 24 hours.  If you had a scopolamine patch placed behind your ear for the management of post- operative nausea and/or vomiting:  1. The medication in the patch is effective for 72 hours, after which it should be removed.  Wrap patch in a tissue and discard in the trash. Wash hands thoroughly with soap and water. 2. You may remove the patch earlier than 72 hours if you experience unpleasant side effects which may include dry mouth, dizziness or visual disturbances. 3. Avoid  touching the patch. Wash your hands with soap and water after contact with the patch.     Post Anesthesia Home Care Instructions  Activity: Get plenty of rest for the remainder of the day. A responsible individual must stay with you for 24 hours following the procedure.  For the next 24 hours, DO NOT: -Drive a car -Paediatric nurse -Drink alcoholic beverages -Take any medication unless instructed by your physician -Make any legal decisions or sign important papers.  Meals: Start with liquid foods such as gelatin or soup. Progress to regular foods as tolerated. Avoid greasy, spicy, heavy foods. If nausea and/or vomiting occur, drink only clear liquids until the nausea and/or vomiting subsides. Call your physician if vomiting continues.  Special Instructions/Symptoms: Your throat may feel dry or sore from the anesthesia or the breathing tube placed in your throat during surgery. If this causes discomfort, gargle with warm salt water. The discomfort should disappear within 24 hours.  If you had a scopolamine patch placed behind your ear for the management of post- operative nausea and/or vomiting:  1. The medication in the patch is effective for 72 hours, after which it should be removed.  Wrap patch in a tissue and discard in the trash. Wash hands thoroughly with soap and water. 2. You may remove the patch earlier than 72 hours if you experience unpleasant side effects which may include dry mouth, dizziness or visual disturbances. 3. Avoid touching the patch. Wash your hands with soap and water after contact with the patch.    Information for Discharge Teaching: EXPAREL (bupivacaine liposome injectable suspension)   Your surgeon or anesthesiologist gave you EXPAREL(bupivacaine) to help control your pain after surgery.   EXPAREL is a local anesthetic that provides pain relief by numbing the tissue around the surgical site.  EXPAREL is designed to release pain medication over time and  can control pain for up to 72 hours.  Depending on how you respond to EXPAREL, you may require less pain medication during your recovery.  Possible side effects:  Temporary loss of sensation or ability to move in the area where bupivacaine was injected.  Nausea, vomiting, constipation  Rarely, numbness and tingling in your mouth or lips, lightheadedness, or anxiety may occur.  Call your doctor right away if you think you may be experiencing any of these sensations, or if you have other questions regarding possible side effects.  Follow all other discharge instructions given to you by your surgeon or nurse. Eat a healthy diet and drink plenty of water or other fluids.  If you return to the hospital for any reason within 96 hours following the administration of EXPAREL, it is important for health care providers to know that you have received this anesthetic. A teal colored band has been placed on your arm with the date, time and amount of EXPAREL you have received in order to alert and inform your health care providers. Please leave this armband in place for the full  96 hours following administration, and then you may remove the band.

## 2019-04-07 NOTE — Transfer of Care (Signed)
Immediate Anesthesia Transfer of Care Note  Patient: Jordan Harrell  Procedure(s) Performed: MINI-LAPAROTOMY (N/A Abdomen) XI ROBOTIC ASSISTED BILATERAL LAPAROSCOPIC  SALPINGO OOPHORECTOMY WITH LYSIS OF ADHESIONS (N/A Abdomen)  Patient Location: PACU  Anesthesia Type:General  Level of Consciousness: awake, alert , oriented and patient cooperative  Airway & Oxygen Therapy: Patient Spontanous Breathing and Patient connected to face mask oxygen  Post-op Assessment: Report given to RN and Post -op Vital signs reviewed and stable  Post vital signs: Reviewed and stable  Last Vitals:  Vitals Value Taken Time  BP 161/98 04/07/19 1132  Temp    Pulse 80 04/07/19 1134  Resp 27 04/07/19 1134  SpO2 100 % 04/07/19 1134  Vitals shown include unvalidated device data.  Last Pain:  Vitals:   04/07/19 0730  TempSrc: Oral  PainSc: 3       Patients Stated Pain Goal: 7 ([id number])  Complications: No apparent anesthesia complications

## 2019-04-07 NOTE — Interval H&P Note (Signed)
History and Physical Interval Note:  04/07/2019 7:41 AM  Jordan Harrell  has presented today for surgery, with the diagnosis of OVARIAN CYST.  The various methods of treatment have been discussed with the patient and family. After consideration of risks, benefits and other options for treatment, the patient has consented to  Procedure(s): MINI-LAPAROTOMY (N/A) XI ROBOTIC ASSISTED BILATERAL LAPAROSCOPIC  SALPINGO OOPHORECTOMY (N/A) POSSIBLE STAGING (N/A) as a surgical intervention.  The patient's history has been reviewed, patient examined, no change in status, stable for surgery.  I have reviewed the patient's chart and labs.  Questions were answered to the patient's satisfaction.     Thereasa Solo

## 2019-04-07 NOTE — Progress Notes (Signed)
Spoke with Dr. Roberts Gaudy and Dr. Everitt Amber about patient having a boil on her buttock which has drained some puss having some odor either yesterday or day before.  Okay for surgery.Dr. Linna Caprice assessed area and Dr. Denman George to assess also.

## 2019-04-07 NOTE — Op Note (Addendum)
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   Procedure(s) Performed: Robotic-assisted laparoscopic bilateral salpingo-oophorectomy, lysis of adhesions (x 2 hours), minilaparotomy, omentectomy   Surgeon: Everitt Amber, M.D.  Assistant Surgeon: Joylene John, NP (a provider assistant was necessary for tissue manipulation, management of robotic instrumentation, retraction and positioning due to the complexity of the case and hospital policies).   Anesthesia: Gen. endotracheal.  Specimens: Bilateral ovaries, fallopian tubes, ascites, omentum.   Estimated Blood Loss: 200 mL. Blood Replacement: None  Complications: none  Indication for Procedure:  The patient presented with a 18cm complex cystic and solid ovarian mass. She had a history of a prior supracervical hysterectomy.   Operative Findings: 18 cm complex cystic mass arising from the left ovary, with large (10 cm) solid component at the inferior aspect where it was adherent to the sigmoid colon and sigmoid colon mesentery and bladder reflection and vaginal cuff.  2 hours of pelvic adhesiolysis necessary to free the adnexal mass.  Appendix grossly normal but densely adherent to the right tube and ovary.  The right ovary was densely adherent to the sigmoid colon and cervical stump.  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   Procedure: The patient's taken to the operating room and placed under general endotracheal anesthesia testing difficulty. She is placed in a dorsolithotomy position and cervical acromial pad was placed. The arms were tucked with care taken to pad the olecranon process. And prepped and draped in usual sterile fashion.   A Foley catheter was placed to gravity.   An infraumbilical midline vertical  incision was made and carried through the subcutaneous tissue to the fascia. The fascial incision was made and extended superiorally. The rectus muscles were separated. The peritoneum was identified and entered. Peritoneal incision was extended longitudinally.  The abdominal cavity was entered sharply and without incident. A Bookwalter retractor was then placed. A survey of the abdomen and pelvis revealed the above findings, which were significant for 100-500c amber ascites, and a large abdominopelvic ovarian mass (left) densely adherent to the sigmoid colon mesentery and sigmoid colon.  Ascites were obtained from the peritoneal cavity for cytology.  An alexis retractor was placed.  The cystic mass was identified and brought to the incision. A gallbladder trochar was used to puncture the cyst and evacuate it of fluid to decompress it. No gross spillage of cyst fluid took place.Allis clamps were used to elevated the puncture site as the cyst was deflated.  The incision on the mass was closed with vicryl and the mass was returned to the abdomen. A gel port seal was placed over the alexis to establish a seal and insufflation of the peritoneal cavity took place via a port placed through this site.   A 62mm incision was made in the left upper quadrant palmer's point and a 73mm trochar was placed through this site.  A supraumbilical 9 mm port was placed for the camera in the midline above the umbilicus.  Two incisions were made lateral to the umbilical incision in the left and right abdomen measuring 49mm. These incisions were made approximately 10 cm lateral to the umbilical incision. 8 mm robotic trochars were inserted. The robot was docked.  The abdomen was inspected as was the pelvis.    For approximately 2 hours complex pelvic  adhesiolysis was necessary using sharp dissection and occasional judicious use of monopolar energy to separate the dense adhesions between the left ovarian mass from the sigmoid  mesentery, and sigmoid colon wall.  Additionally the mass was densely adherent to the left ovarian fossa and the bladder reflection anteriorly.  The right tube and ovary was grossly normal but densely adherent to the appendix, right ovarian fossa, and the mass.  During the process of adhesiolysis approximately 200 cc of blood was lost from the dissection planes.  After the attachments to the colon and sidewall and appendix had been made the nephrectomy could proceed.  An incision was made on the right pelvic side wall peritoneum parallel to the IP ligament and the retroperitoneal space entered. The right ureter was identified and the para-rectal space was developed. A window was created in the right broad ligament above the ureter. The right infundibulopelvic vessels were skeletonized cauterized and transected. The round ligament attachments similarly were cauterized and transected. Specimen was placed in an Endo Catch bag.  In a similar manner the left peritoneum and the side wall was incised, and the retroperitoneal space entered. The left ureter was identified and the left pararectal space was developed. The round ligament attachment was skeletonized cauterized and transected. The left utero-ovarian ligaments were cauterized and transected in the left adnexa was displaced into the upper abdomen.  The abdomen was copiously irrigated and drained and all operative sites inspected and hemostasis was assured and reinforced with floseal (surgiflow).  The colonic wall was closely evaluated.  There was small bleeding vessels noted in the mesentery which were made hemostatic with interrupted 3-0 Vicryl suture.  Areas of deserosalized colonic wall were imbricated with Lembert sutures using 3-0 Vicryl material.  The robot was undocked.  The specimens were retrieved from the mid and mini laparotomy incision.  The omentum was delivered into the incision and using monopolar Bovie windows were made in the infracolic  omentum and transection of the omental pedicles using hemostats to assist in sealing a lot of omental vessels.  An omental biopsy was sent while frozen section was pending given the concern for malignancy with the solid components to the ovarian mass.  The frozen section returned as a spindle cell tumor with atypical features, possibly a STUMP versus leiomyoma.  The fascia at the mini laparotomy was closed with 0 looped PDS with 2 sutures running.  Exparel with Marcaine was infiltrated into all incisions with emphasis on the mini laparotomy.  The ports were all removed. The fascial closure at the left upper quadrant port was made with 0 Vicryl.  All incisions were closed with a running subcuticular Monocryl suture. Dermabond was applied. Sponge, lap and needle counts were correct x 3.    The patient had sequential compression devices for VTE prophylaxis.         Disposition: PACU         Condition: stable   Donaciano Eva, MD

## 2019-04-07 NOTE — Anesthesia Procedure Notes (Signed)
Procedure Name: Intubation Date/Time: 04/07/2019 8:14 AM Performed by: Garrel Ridgel, CRNA Pre-anesthesia Checklist: Patient identified, Emergency Drugs available, Suction available and Patient being monitored Patient Re-evaluated:Patient Re-evaluated prior to induction Oxygen Delivery Method: Circle system utilized Preoxygenation: Pre-oxygenation with 100% oxygen Induction Type: IV induction Ventilation: Mask ventilation without difficulty Laryngoscope Size: Mac and 4 Grade View: Grade III Tube type: Oral Number of attempts: 1 Airway Equipment and Method: Stylet and Oral airway Placement Confirmation: ETT inserted through vocal cords under direct vision,  positive ETCO2 and breath sounds checked- equal and bilateral Secured at: 21 cm Tube secured with: Tape Dental Injury: Teeth and Oropharynx as per pre-operative assessment

## 2019-04-08 ENCOUNTER — Telehealth: Payer: Self-pay

## 2019-04-08 ENCOUNTER — Encounter (HOSPITAL_BASED_OUTPATIENT_CLINIC_OR_DEPARTMENT_OTHER): Payer: Self-pay | Admitting: Gynecologic Oncology

## 2019-04-08 LAB — CYTOLOGY - NON PAP

## 2019-04-08 NOTE — Telephone Encounter (Signed)
Ms Granlund states that her abdomen is very sore.  She is using the Ibuprofen 800 mg q 8 hours.  It is not effective for pain control.  Told her to continue medication as it helps with inflammation  from surgery. She can alt ibuprofen with tylenol 500 mg to 1000 mg every 6 hours. She has been using the Oxycodone 10 mg tabs every 4 hours. This is effective for pain relief. Suggested using a heating pad for pain as well. She is eating, drinking, and urinating well. She is not passing gas. She will start the senokot-s today and also add  Miralax  1 capful bid prn. No nausea or vomiting. Dressing and Incisions are D&I. Pt has office number [phone] if she has any questions or issues. Told her to call if she develops N/V and increased abdominal pain. Pt verbalized understanding.

## 2019-04-08 NOTE — Telephone Encounter (Signed)
Pt LM at ~1450 that she needed a call back about something she was experiencing.  Called back and phone went to VM. LM to call back and leave a detailed message if the phone is not answered as we are in clinic.

## 2019-04-08 NOTE — Telephone Encounter (Signed)
VM left for Jordan Harrell to call us for post op call.

## 2019-04-09 ENCOUNTER — Telehealth: Payer: Self-pay

## 2019-04-09 LAB — SURGICAL PATHOLOGY

## 2019-04-09 NOTE — Telephone Encounter (Signed)
I spoke with Jordan Harrell today.  She is feeling better today.  Her pain is controlled with current regimine.

## 2019-04-11 ENCOUNTER — Telehealth: Payer: Self-pay

## 2019-04-11 NOTE — Telephone Encounter (Signed)
LM stating that the surgical pathology showed no cancer.  Keep appointment on 04-20-19 as scheduled for her post op appointment. She can call the office if she has any questions or concerns.

## 2019-04-15 ENCOUNTER — Telehealth: Payer: Self-pay

## 2019-04-15 NOTE — Telephone Encounter (Signed)
Pt using the 1-3 oxycodone a day .  Ibuprofen 800 mg every 8 hours. Pt had 4 tablets left as of yesterday. Told Ms Hopf that Dr. Denman George will not refill her oxycodone. If her pain is that severe, she needs a CT Scan to evaluate. Pt hung up the phone.

## 2019-04-30 ENCOUNTER — Other Ambulatory Visit: Payer: Self-pay

## 2019-04-30 ENCOUNTER — Encounter: Payer: Self-pay | Admitting: Gynecologic Oncology

## 2019-04-30 ENCOUNTER — Inpatient Hospital Stay: Payer: Managed Care, Other (non HMO) | Attending: Gynecologic Oncology | Admitting: Gynecologic Oncology

## 2019-04-30 VITALS — BP 113/68 | HR 100 | Temp 97.8°F | Resp 17 | Ht 64.5 in | Wt 203.0 lb

## 2019-04-30 DIAGNOSIS — I10 Essential (primary) hypertension: Secondary | ICD-10-CM | POA: Insufficient documentation

## 2019-04-30 DIAGNOSIS — F17210 Nicotine dependence, cigarettes, uncomplicated: Secondary | ICD-10-CM | POA: Diagnosis not present

## 2019-04-30 DIAGNOSIS — Z90722 Acquired absence of ovaries, bilateral: Secondary | ICD-10-CM | POA: Insufficient documentation

## 2019-04-30 DIAGNOSIS — I252 Old myocardial infarction: Secondary | ICD-10-CM | POA: Insufficient documentation

## 2019-04-30 DIAGNOSIS — Z90710 Acquired absence of both cervix and uterus: Secondary | ICD-10-CM | POA: Insufficient documentation

## 2019-04-30 DIAGNOSIS — D271 Benign neoplasm of left ovary: Secondary | ICD-10-CM | POA: Diagnosis present

## 2019-04-30 DIAGNOSIS — R1900 Intra-abdominal and pelvic swelling, mass and lump, unspecified site: Secondary | ICD-10-CM

## 2019-04-30 NOTE — Patient Instructions (Signed)
If your rectal discomfort increases or does not go away, please call Dr Serita Grit office and she will order a CT scan to look for a collection.  Dr Denman George has written to release you to return to work at December 7th.
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
## 2019-05-13 ENCOUNTER — Telehealth: Payer: Self-pay | Admitting: *Deleted

## 2019-05-13 ENCOUNTER — Encounter: Payer: Self-pay | Admitting: Gynecologic Oncology

## 2019-05-13 ENCOUNTER — Telehealth: Payer: Self-pay

## 2019-05-13 NOTE — Telephone Encounter (Signed)
LM for Jordan Harrell to call the office back tomorrow to discuss where the return to work letter needs to be sent and what the content of the letter is regarding restriction of pulling furniture.

## 2019-05-13 NOTE — Telephone Encounter (Signed)
Jordan Harrell called back and the content of the letter was reviewed with her.  Will fax letter to [phone] Attention [facility] Read.

## 2019-05-13 NOTE — Telephone Encounter (Signed)
Faxed Letter to Ms Read as noted below.

## 2019-05-13 NOTE — Telephone Encounter (Signed)
Per request from [place] fax office note 04/30/2019

## 2019-06-20 ENCOUNTER — Telehealth: Payer: Self-pay

## 2019-06-20 NOTE — Telephone Encounter (Signed)
LM for Jordan Harrell to call back Monday morning to discuss her swallowing issue since surgery.

## 2019-06-23 ENCOUNTER — Telehealth: Payer: Self-pay

## 2019-06-23 NOTE — Telephone Encounter (Signed)
Ms Cerf states that she began with some difficulty swallowing certain foods such as bread and chicken.  She does use fluids to moisten the food but it still takes time to swallow. Reviewed with Joylene John, NP. Recommended that she see her PCP to be evaluated.  Ms Delmonico states that she is only experiencing the rectal fullness/pressure only occasionally as this was discussed at her post op appointment on 04-30-19 Pt verbalized understanding.

## 2020-05-12 ENCOUNTER — Emergency Department (HOSPITAL_BASED_OUTPATIENT_CLINIC_OR_DEPARTMENT_OTHER)
Admission: EM | Admit: 2020-05-12 | Discharge: 2020-05-13 | Disposition: A | Discharge status: Home / Self Care | Payer: Managed Care, Other (non HMO) | Attending: Emergency Medicine | Admitting: Emergency Medicine

## 2020-05-12 ENCOUNTER — Encounter (HOSPITAL_BASED_OUTPATIENT_CLINIC_OR_DEPARTMENT_OTHER): Payer: Self-pay | Admitting: Emergency Medicine

## 2020-05-12 DIAGNOSIS — K222 Esophageal obstruction: Secondary | ICD-10-CM

## 2020-05-12 DIAGNOSIS — X58XXXA Exposure to other specified factors, initial encounter: Secondary | ICD-10-CM | POA: Insufficient documentation

## 2020-05-12 DIAGNOSIS — F17210 Nicotine dependence, cigarettes, uncomplicated: Secondary | ICD-10-CM | POA: Diagnosis not present

## 2020-05-12 DIAGNOSIS — T18128A Food in esophagus causing other injury, initial encounter: Secondary | ICD-10-CM | POA: Diagnosis present

## 2020-05-12 MED ORDER — GLUCAGON HCL RDNA (DIAGNOSTIC) 1 MG IJ SOLR
1.0000 mg | Freq: Once | INTRAMUSCULAR | Status: DC
  Filled 2020-05-12: qty 1

## 2020-05-12 MED ORDER — SUCRALFATE 1 GM/10ML PO SUSP
ORAL | Status: AC
  Administered 2020-05-12: 1 g
  Filled 2020-05-12: qty 10

## 2020-05-12 MED ORDER — GLUCAGON HCL RDNA (DIAGNOSTIC) 1 MG IJ SOLR
1.0000 mg | Freq: Once | INTRAMUSCULAR | Status: AC
  Administered 2020-05-12: 1 mg via INTRAVENOUS
  Filled 2020-05-12: qty 1

## 2020-05-12 MED ORDER — ONDANSETRON HCL 4 MG/2ML IJ SOLN
4.0000 mg | Freq: Once | INTRAMUSCULAR | Status: AC
  Administered 2020-05-12: 4 mg via INTRAVENOUS
  Filled 2020-05-12: qty 2

## 2020-05-12 NOTE — ED Notes (Signed)
Speech clear, no drooling, able to swallow saliva. No obstruction observed.

## 2020-05-12 NOTE — ED Provider Notes (Signed)
[facility] Provider Note: Georgena Spurling, MD, FACEP  CSN: [id number] MRN: [id number] ARRIVAL: 05/12/20 at 2007 ROOM: [place]  Foreign Body   HISTORY OF PRESENT ILLNESS  05/12/20 11:13 PM Jordan Harrell is a 59 y.o. female with a history of swallowing problems (esophageal stricture?).  She was eating fish earlier this evening and a piece got caught in her esophagus.  She had a sensation of discomfort (which is hard for her to characterize) in her upper esophagus which she rated as a 7 out of 10.  She was given glucagon IV 1 mg prior to my evaluation and now states the face she feels like it is in her lower esophagus.  She has been able to swallow liquids past the food bolus but states it hurts to do so.    She is also complaining of generalized pain and nausea which she attributes to a reaction to Bactrim which she was recently prescribed for UTI.  Past Medical History:  Diagnosis Date  . Anemia   . Constipation   . History of uterine fibroid   . Ovarian cyst     Past Surgical History:  Procedure Laterality Date  . BLADDER SUSPENSION  1979   in [place]   via laparotomy  . CESAREAN SECTION  x2  last one 1980s  . LAPAROTOMY N/A 04/07/2019   Procedure: MINI-LAPAROTOMY;  Surgeon: Everitt Amber, MD;  Location: [facility];  Service: Gynecology;  Laterality: N/A;  . ROBOTIC ASSISTED BILATERAL SALPINGO OOPHERECTOMY N/A 04/07/2019   Procedure: XI ROBOTIC ASSISTED BILATERAL LAPAROSCOPIC  SALPINGO OOPHORECTOMY WITH LYSIS OF ADHESIONS;  Surgeon: Everitt Amber, MD;  Location: [facility];  Service: Gynecology;  Laterality: N/A;  . SUPRACERVICAL ABDOMINAL HYSTERECTOMY  08-18-2003   @[facility]     Family History  Problem Relation Age of Onset  . Cancer Mother   . Hypertension Mother   . Hypertension Father   . Hypertension Sister   . Diabetes Sister   . Hypertension Brother   . Diabetes Brother     Social History    Tobacco Use  . Smoking status: Current Every Day Smoker    Packs/day: 0.50    Years: 20.00    Pack years: 10.00    Types: Cigarettes  . Smokeless tobacco: Never Used  Vaping Use  . Vaping Use: Never used  Substance Use Topics  . Alcohol use: No  . Drug use: Never    Prior to Admission medications   Medication Sig Start Date End Date Taking? Authorizing Provider  albuterol (VENTOLIN HFA) 108 (90 Base) MCG/ACT inhaler  03/24/19   [provider]    Allergies Amoxicillin   REVIEW OF SYSTEMS  Negative except as noted here or in the History of Present Illness.   PHYSICAL EXAMINATION  Initial Vital Signs Blood pressure 134/84, pulse 98, temperature 98.4 F (36.9 C), temperature source Oral, resp. rate 16, height 5\' 4"  (1.626 m), weight 86.6 kg, SpO2 99 %.  Examination General: Well-developed, well-nourished female in no acute distress; appearance consistent with age of record HENT: normocephalic; atraumatic Eyes: pupils equal, round and reactive to light; extraocular muscles intact Neck: supple Heart: regular rate and rhythm Lungs: clear to auscultation bilaterally Abdomen: soft; nondistended; nontender; bowel sounds present Extremities: No deformity; full range of motion; pulses normal Neurologic: Awake, alert and oriented; motor function intact in all extremities and symmetric; no facial droop Skin: Warm and dry Psychiatric: Tearful   RESULTS  Summary of  this visit's results, reviewed and interpreted by myself:   EKG Interpretation  Date/Time:    Ventricular Rate:    PR Interval:    QRS Duration:   QT Interval:    QTC Calculation:   R Axis:     Text Interpretation:        Laboratory Studies: No results found for this or any previous visit (from the past 24 hour(s)). Imaging Studies: No results found.  ED COURSE and MDM  Nursing notes, initial and subsequent vitals signs, including pulse oximetry, reviewed and interpreted by  myself.  Vitals:   05/12/20 2016 05/12/20 2018 05/12/20 2220  BP:  (!) 143/83 134/84  Pulse:  (!) 113 98  Resp:  16 16  Temp:  98.4 F (36.9 C)   TempSrc:  Oral   SpO2:  98% 99%  Weight: 86.6 kg    Height: 5\' 4"  (1.626 m)     Medications  glucagon (human recombinant) (GLUCAGEN) injection 1 mg (1 mg Intravenous Given 05/12/20 2307)  ondansetron (ZOFRAN) injection 4 mg (4 mg Intravenous Given 05/12/20 2332)  sucralfate (CARAFATE) 1 GM/10ML suspension (1 g  Given 05/12/20 2341)   12:35 AM Patient feeling much better and states she is ready to go home.  She is drinking fluids without difficulty.   PROCEDURES  Procedures   ED DIAGNOSES     ICD-10-CM   1. Esophageal obstruction due to food impaction  K22.2    Z61.096E       Shanon Rosser, MD 05/13/20 [phone]

## 2020-05-12 NOTE — ED Triage Notes (Signed)
Pt states she has a piece of food stuck in her throat  Pt states it happened a couple of hours ago  Pt has hx for same and had a procedure but recently it has been happening again but usually it will go down or either come back up  Pt also c/o cramping all over and constant nausea from taking septra DS for a UTI

## 2020-05-12 NOTE — ED Notes (Signed)
Pt drank glass of apple juice, no emesis or choking. Pt states her stomach hurts. MD aware.

## 2020-05-12 NOTE — ED Notes (Signed)
Pt up to Bathroom holding lower back, pt states her back hurts, she is nauseated, headache. Sx's ongoing before arrival. Speech clear, no drooling or spitting. Airway intact.

## 2020-05-12 NOTE — ED Notes (Signed)
ED Provider at bedside. 

## 2020-05-13 NOTE — ED Notes (Signed)
PT states she feels better and wants to go home. MD aware. Airway intact, no emesis, no drooling, speech clear.

## 2021-04-10 IMAGING — CT CT ABD-PELV W/ CM
2 of 5 series · 12 of 46 positions shown, 14 images · IV contrast (APPLIED)
Comparison: None.
COMPARISON: None.

Addendum:
CLINICAL DATA: 58-year-old with abdominal distension and pain.

EXAM:
CT ABDOMEN AND PELVIS WITH CONTRAST
TECHNIQUE: Multidetector CT imaging of the abdomen and pelvis was performed
using the standard protocol following bolus administration of
intravenous contrast.
CONTRAST:  100mL OMNIPAQUE IOHEXOL 300 MG/ML  SOLN

[Series 2: axial st · axial · 0.85mm/px · z∈[-606,-206]mm · 9 of 97 slices shown, 11 images]
[im 11/97  soft-tissue]
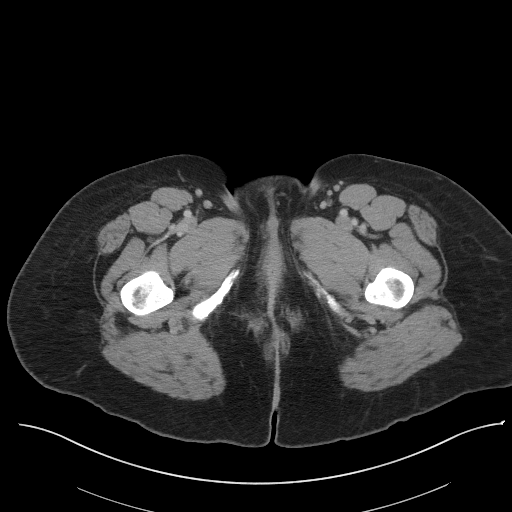
[im 11/97  bone]
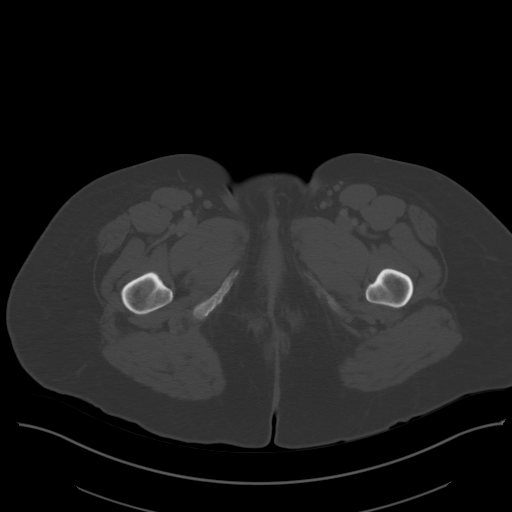
[im 21/97  soft-tissue]
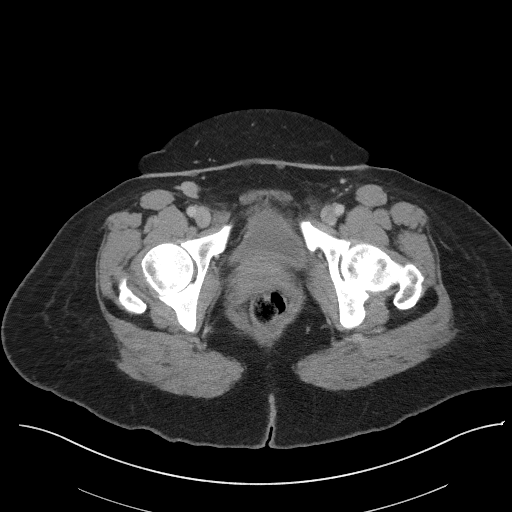
[im 31/97  soft-tissue]
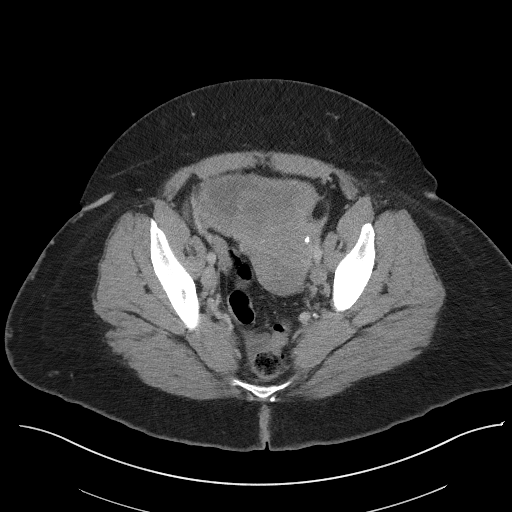
[im 41/97  soft-tissue]
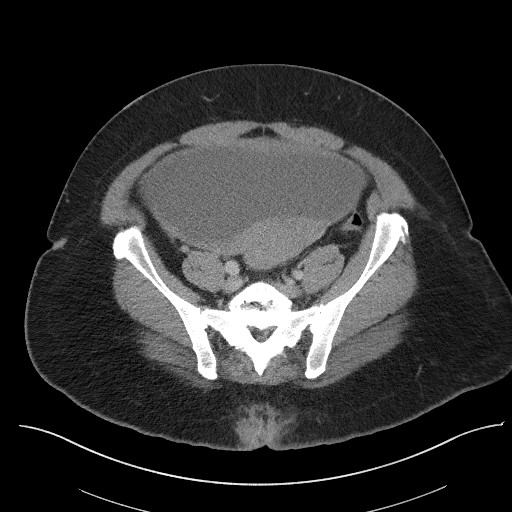
[im 51/97  soft-tissue]
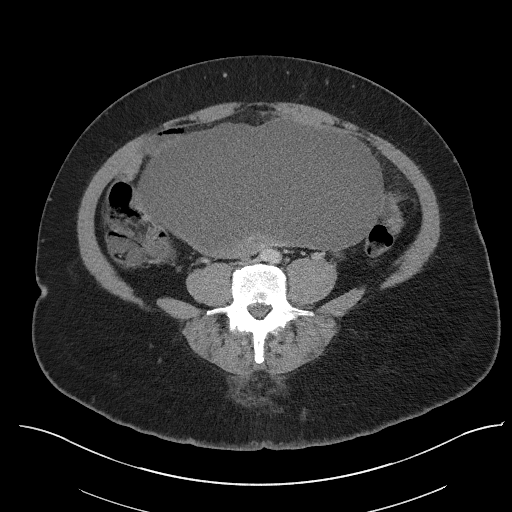
[im 61/97  soft-tissue]
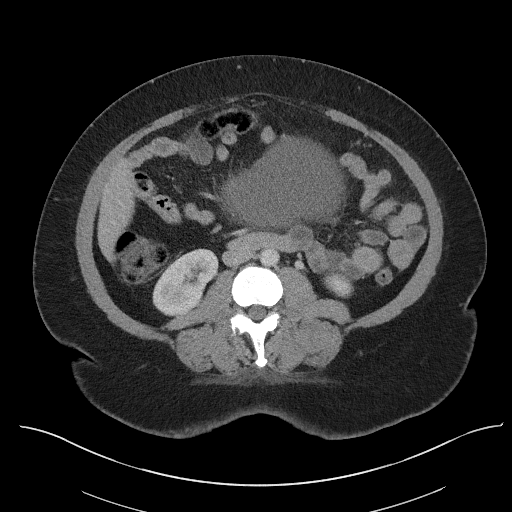
[im 71/97  soft-tissue]
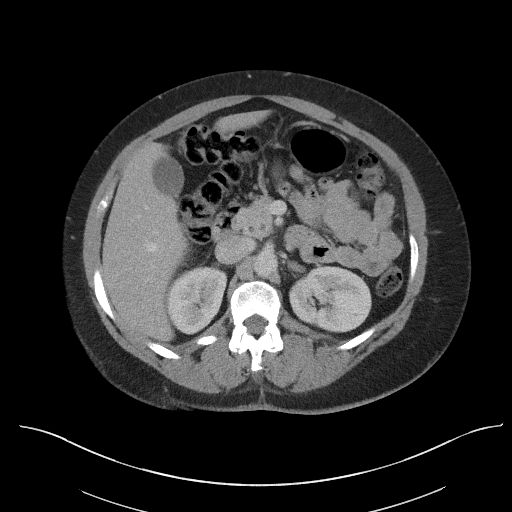
[im 81/97  soft-tissue]
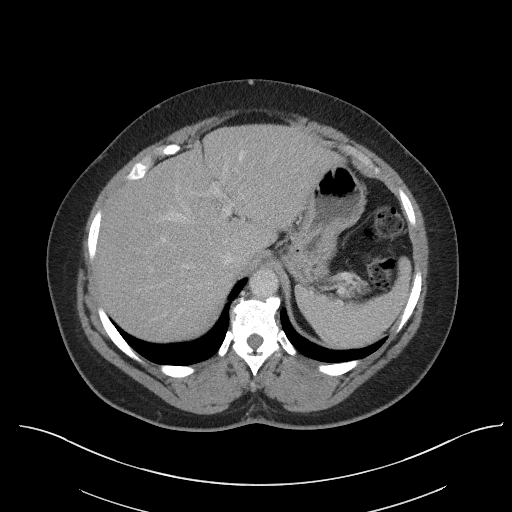
[im 91/97  soft-tissue]
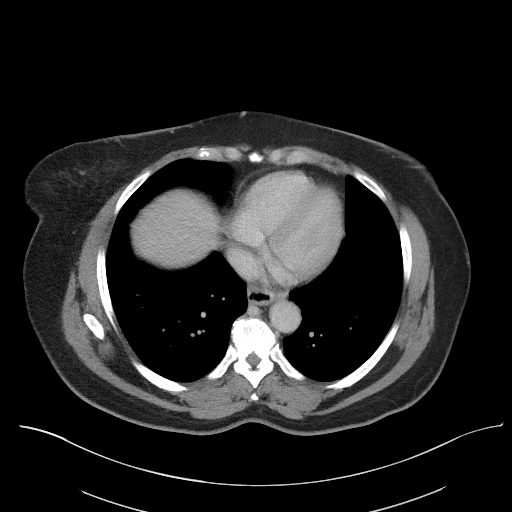
[im 91/97  bone]
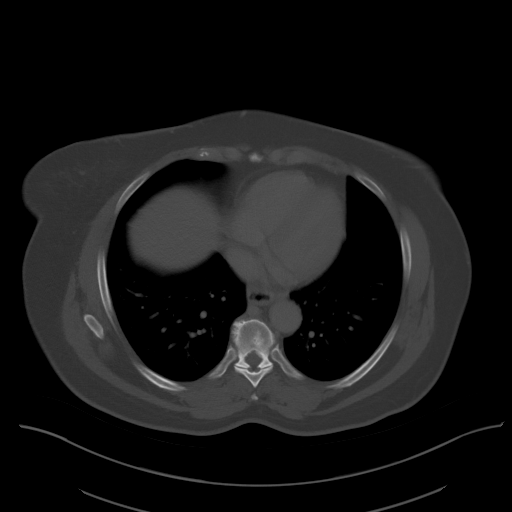

[Series 5: coronal st · coronal · 0.68mm/px · 3 of 107 slices shown]
[im 36/107  soft-tissue]
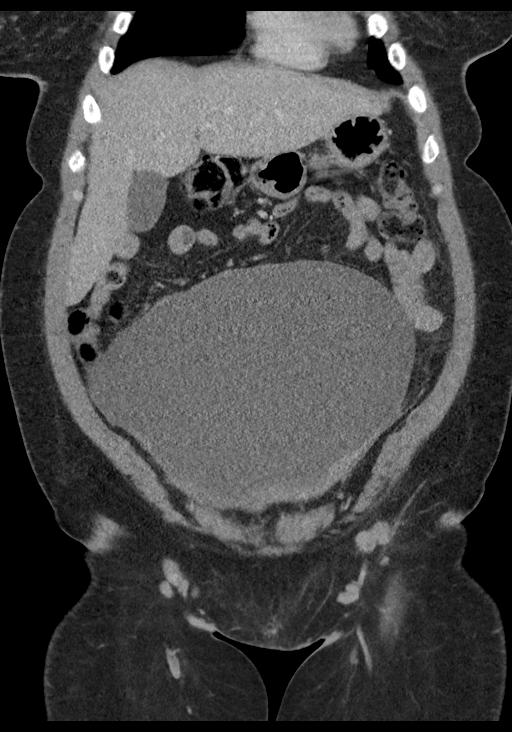
[im 48/107  soft-tissue]
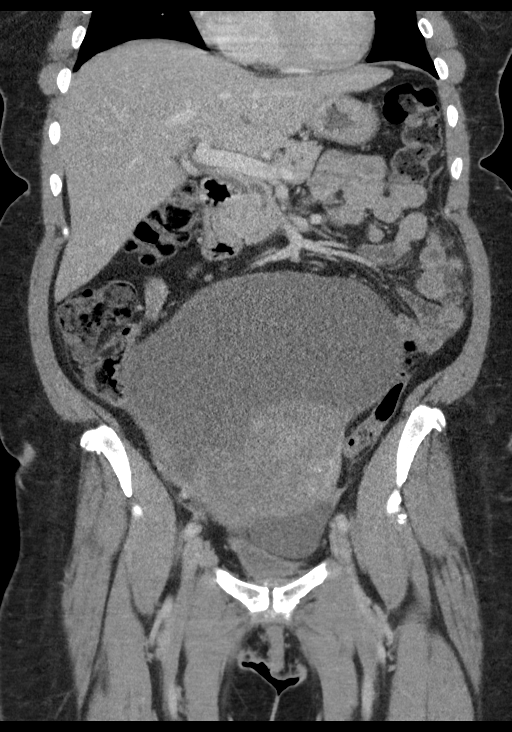
[im 59/107  soft-tissue]
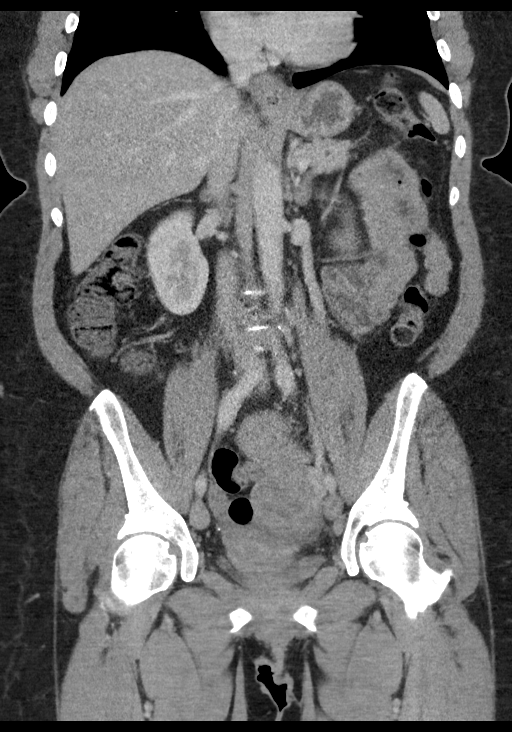

[12 of 46 positions shown; findings below may reference images not displayed]

FINDINGS: Lower chest: Lung bases are clear. Patchy nodular densities at the
breast tissue is nonspecific.

Hepatobiliary: Normal appearance of the liver, gallbladder and
portal venous system. No significant biliary dilatation.

Pancreas: Mild dilatation of the main pancreatic duct measuring up
to 4 mm. No evidence for pancreatic inflammation.

Spleen: Normal in size without focal abnormality.

Adrenals/Urinary Tract: Mild fullness of both adrenal glands which
that may represent hyperplasia. Normal appearance of both kidneys
without hydronephrosis. No suspicious renal lesions. Urinary bladder
is decompressed.

Stomach/Bowel: Stomach is within normal limits. Appendix appears
normal. No evidence of bowel wall thickening, distention, or
inflammatory changes. Small hiatal hernia.

Vascular/Lymphatic: Mildly prominent inguinal lymph nodes
bilaterally. No significant abdominal or pelvic lymphadenopathy.
Atherosclerotic calcifications in the abdominal aorta without
aneurysm.

Reproductive: There is a large mass associated with the uterus and
adnexal structures. Mass has a large cystic component that extends
into the abdomen. Mass appears to be contiguous with the uterus.
Mass and uterus measures 20.3 x 10.8 x 21.2 cm. Cystic component
measures roughly 15.2 x 10.8 x 21.2 cm. Cannot tell if this lesion
originates from the uterus or left adnexa. Lesion is probably
separate from the right ovary and right adnexa. There may be a
separate fluid collection or cystic component anterior to the uterus
and above the urinary bladder that measures 5.1 x 2.9 x 3.1 cm.

Other: Small amount of free fluid in the pelvis. No suspicious
omental or peritoneal nodularity. Negative for free air.

Musculoskeletal: No acute bone abnormality. Vacuum disc phenomena at
L4-L5 and L5-S1. Mild facet arthropathy in the lumbar spine.
IMPRESSION: 1. Large pelvic mass that extends into the abdomen. Findings are
suggestive for a primary uterine or adnexal neoplasm. Not clear if
the mass originates from the left adnexa or the uterus. Lesion
appears to be separate from the right adnexa. Pelvic mass may be
better evaluated with an abdominopelvic MRI. In addition, there is a
second cystic component in the anterior pelvic region between the
uterus and urinary bladder that measures up to 5.1 cm.
2. Mild dilatation of the pancreatic duct of uncertain etiology.
This area could also be evaluated with an MRI.
3. Small nodular structures in the inferior breast tissue. Recommend
correlation with mammography.

These results were called by telephone at the time of interpretation
on 03/05/2019 at [DATE] to provider ELS-MARIE RIESBECK , who verbally
acknowledged these results.

ADDENDUM:
Recommend gynecology consultation.

*** End of Addendum ***
FINDINGS: Lower chest: Lung bases are clear. Patchy nodular densities at the
breast tissue is nonspecific.

Hepatobiliary: Normal appearance of the liver, gallbladder and
portal venous system. No significant biliary dilatation.

Pancreas: Mild dilatation of the main pancreatic duct measuring up
to 4 mm. No evidence for pancreatic inflammation.

Spleen: Normal in size without focal abnormality.

Adrenals/Urinary Tract: Mild fullness of both adrenal glands which
that may represent hyperplasia. Normal appearance of both kidneys
without hydronephrosis. No suspicious renal lesions. Urinary bladder
is decompressed.

Stomach/Bowel: Stomach is within normal limits. Appendix appears
normal. No evidence of bowel wall thickening, distention, or
inflammatory changes. Small hiatal hernia.

Vascular/Lymphatic: Mildly prominent inguinal lymph nodes
bilaterally. No significant abdominal or pelvic lymphadenopathy.
Atherosclerotic calcifications in the abdominal aorta without
aneurysm.

Reproductive: There is a large mass associated with the uterus and
adnexal structures. Mass has a large cystic component that extends
into the abdomen. Mass appears to be contiguous with the uterus.
Mass and uterus measures 20.3 x 10.8 x 21.2 cm. Cystic component
measures roughly 15.2 x 10.8 x 21.2 cm. Cannot tell if this lesion
originates from the uterus or left adnexa. Lesion is probably
separate from the right ovary and right adnexa. There may be a
separate fluid collection or cystic component anterior to the uterus
and above the urinary bladder that measures 5.1 x 2.9 x 3.1 cm.

Other: Small amount of free fluid in the pelvis. No suspicious
omental or peritoneal nodularity. Negative for free air.

Musculoskeletal: No acute bone abnormality. Vacuum disc phenomena at
L4-L5 and L5-S1. Mild facet arthropathy in the lumbar spine.
IMPRESSION: 1. Large pelvic mass that extends into the abdomen. Findings are
suggestive for a primary uterine or adnexal neoplasm. Not clear if
the mass originates from the left adnexa or the uterus. Lesion
appears to be separate from the right adnexa. Pelvic mass may be
better evaluated with an abdominopelvic MRI. In addition, there is a
second cystic component in the anterior pelvic region between the
uterus and urinary bladder that measures up to 5.1 cm.
2. Mild dilatation of the pancreatic duct of uncertain etiology.
This area could also be evaluated with an MRI.
3. Small nodular structures in the inferior breast tissue. Recommend
correlation with mammography.

These results were called by telephone at the time of interpretation
on 03/05/2019 at [DATE] to provider ELS-MARIE RIESBECK , who verbally
acknowledged these results.

## 2023-07-20 ENCOUNTER — Ambulatory Visit: Payer: BLUE CROSS/BLUE SHIELD | Admitting: Family

## 2023-11-30 ENCOUNTER — Encounter: Payer: Self-pay | Admitting: Family

## 2023-11-30 ENCOUNTER — Ambulatory Visit: Admitting: Family

## 2023-11-30 VITALS — BP 130/82 | HR 77 | Ht 64.0 in | Wt 204.8 lb

## 2023-11-30 DIAGNOSIS — R030 Elevated blood-pressure reading, without diagnosis of hypertension: Secondary | ICD-10-CM | POA: Diagnosis not present

## 2023-11-30 DIAGNOSIS — Z1211 Encounter for screening for malignant neoplasm of colon: Secondary | ICD-10-CM | POA: Diagnosis not present

## 2023-11-30 DIAGNOSIS — Z0000 Encounter for general adult medical examination without abnormal findings: Secondary | ICD-10-CM | POA: Diagnosis not present

## 2023-11-30 DIAGNOSIS — Z13220 Encounter for screening for lipoid disorders: Secondary | ICD-10-CM

## 2023-11-30 DIAGNOSIS — Z1231 Encounter for screening mammogram for malignant neoplasm of breast: Secondary | ICD-10-CM | POA: Diagnosis not present

## 2023-11-30 DIAGNOSIS — Z720 Tobacco use: Secondary | ICD-10-CM

## 2023-11-30 NOTE — Progress Notes (Signed)
 Jordan Harrell is a 63 y.o. female with the following history as recorded in [employer]:  Patient Active Problem List   Diagnosis Date Noted   Boil of buttock 04/07/2019   Pelvic adhesions    Pelvic mass 03/17/2019   Obesity (BMI 35.0-39.9 without comorbidity) 03/17/2019   Tobacco abuse 03/17/2019   History of MI (myocardial infarction) 03/17/2019    No current outpatient medications on file.   No current facility-administered medications for this visit.    Allergies: Amoxicillin  Past Medical History:  Diagnosis Date   Anemia    Constipation    History of uterine fibroid    Ovarian cyst     Past Surgical History:  Procedure Laterality Date   BLADDER SUSPENSION  1979   in [place]    via laparotomy   CESAREAN SECTION  x2  last one 1980s   LAPAROTOMY N/A 04/07/2019   Procedure: MINI-LAPAROTOMY;  Surgeon: Alphonso Aschoff, MD;  Location: [facility];  Service: Gynecology;  Laterality: N/A;   ROBOTIC ASSISTED BILATERAL SALPINGO OOPHERECTOMY N/A 04/07/2019   Procedure: XI ROBOTIC ASSISTED BILATERAL LAPAROSCOPIC  SALPINGO OOPHORECTOMY WITH LYSIS OF ADHESIONS;  Surgeon: Alphonso Aschoff, MD;  Location: [facility];  Service: Gynecology;  Laterality: N/A;   SUPRACERVICAL ABDOMINAL HYSTERECTOMY  08-18-2003   @[facility]     Family History  Problem Relation Age of Onset   Cancer Mother    Hypertension Mother    Hypertension Father    Hypertension Sister    Diabetes Sister    Hypertension Brother    Diabetes Brother     Social History   Tobacco Use   Smoking status: Every Day    Current packs/day: 0.50    Average packs/day: 0.5 packs/day for 20.0 years (10.0 ttl pk-yrs)    Types: Cigarettes   Smokeless tobacco: Never  Substance Use Topics   Alcohol use: No    Subjective:   Presents today as a new patient; she is asking for any type of preventive care that is available as she knows her insurance will cover this for her with no cost issues;   Notes  she has a history of hypertension- was diagnosed after death of first child/ stress induced; took medication for about 5 years due to stress reaction and notes has been off any blood pressure medication since 1986; Went to ER in March 2025 with concerns for sudden blood pressure spike- unclear as to why no medication for further treatment initiated;   Review of Systems  Constitutional: Negative.   HENT: Negative.    Eyes: Negative.   Respiratory: Negative.    Cardiovascular: Negative.   Gastrointestinal: Negative.   Genitourinary: Negative.   Musculoskeletal: Negative.   Skin: Negative.   Neurological: Negative.   Endo/Heme/Allergies: Negative.   Psychiatric/Behavioral: Negative.         Objective:  Vitals:   11/30/23 1529  BP: 130/82  Pulse: 77  SpO2: 100%  Weight: 204 lb 12.8 oz (92.9 kg)  Height: 5' 4 (1.626 m)    General: Well developed, well nourished, in no acute distress  Skin : Warm and dry.  Head: Normocephalic and atraumatic  Eyes: Sclera and conjunctiva clear; pupils round and reactive to light; extraocular movements intact  Ears: External normal; canals clear; tympanic membranes normal  Oropharynx: Pink, supple. No suspicious lesions  Neck: Supple without thyromegaly, adenopathy  Lungs: Respirations unlabored; clear to auscultation bilaterally without wheeze, rales, rhonchi  CVS exam: normal rate and regular rhythm.  Abdomen: Soft; nontender;  nondistended; normoactive bowel sounds; no masses or hepatosplenomegaly  Musculoskeletal: No deformities; no active joint inflammation  Extremities: No edema, cyanosis, clubbing  Vessels: Symmetric bilaterally  Neurologic: Alert and oriented; speech intact; face symmetrical; moves all extremities well; CNII-XII intact without focal deficit   Assessment:  1. PE (physical exam), annual   2. Elevated blood pressure reading   3. Visit for screening mammogram   4. Colon cancer screening   5. Lipid screening   6. Tobacco  abuse     Plan:  Age appropriate preventive healthcare needs addressed; encouraged regular eye doctor and dental exams; encouraged regular exercise; patient is not ready to quit smoking at this time; Order updated for mammogram, colonoscopy, lung cancer screening; Will update referral to cardiology due to concerning ER visit from March 2025- patient indicates that she may not follow up on this referral due to cost concerns. I did ask her to at least consider doing the consult to see what type of testing may be offered.   No follow-ups on file.  Orders Placed This Encounter  Procedures   MM Digital Screening    Standing Status:   Future    Expiration Date:   11/29/2024    Reason for Exam (SYMPTOM  OR DIAGNOSIS REQUIRED):   screening mammogram    Preferred imaging location?:   [facility]   CBC with Differential/Platelet   Comp Met (CMET)   Lipid panel   Hemoglobin A1c   Ambulatory referral to Cardiology    Referral Priority:   Routine    Referral Type:   Consultation    Referral Reason:   Specialty Services Required    Number of Visits Requested:   1   Ambulatory referral to Gastroenterology    Referral Priority:   Routine    Referral Type:   Consultation    Referral Reason:   Specialty Services Required    Number of Visits Requested:   1   Ambulatory Referral Lung Cancer Screening [facility]    Referral Priority:   Routine    Referral Type:   Consultation    Referral Reason:   Specialty Services Required    Number of Visits Requested:   1    Requested Prescriptions    No prescriptions requested or ordered in this encounter

## 2023-12-01 LAB — CBC WITH DIFFERENTIAL/PLATELET
Absolute Lymphocytes: 3053 {cells}/uL (ref 850–3900)
Absolute Monocytes: 564 {cells}/uL (ref 200–950)
Basophils Absolute: 20 {cells}/uL (ref 0–200)
Basophils Relative: 0.3 %
Eosinophils Absolute: 177 {cells}/uL (ref 15–500)
Eosinophils Relative: 2.6 %
HCT: 31.3 % — ABNORMAL LOW (ref 35.0–45.0)
Hemoglobin: 9.4 g/dL — ABNORMAL LOW (ref 11.7–15.5)
MCH: 23.1 pg — ABNORMAL LOW (ref 27.0–33.0)
MCHC: 30 g/dL — ABNORMAL LOW (ref 32.0–36.0)
MCV: 76.9 fL — ABNORMAL LOW (ref 80.0–100.0)
MPV: 8.6 fL (ref 7.5–12.5)
Monocytes Relative: 8.3 %
Neutro Abs: 2985 {cells}/uL (ref 1500–7800)
Neutrophils Relative %: 43.9 %
Platelets: 349 10*3/uL (ref 140–400)
RBC: 4.07 10*6/uL (ref 3.80–5.10)
RDW: 18.4 % — ABNORMAL HIGH (ref 11.0–15.0)
Total Lymphocyte: 44.9 %
WBC: 6.8 10*3/uL (ref 3.8–10.8)

## 2023-12-01 LAB — COMPREHENSIVE METABOLIC PANEL WITH GFR
AG Ratio: 1.5 (calc) (ref 1.0–2.5)
ALT: 14 U/L (ref 6–29)
AST: 19 U/L (ref 10–35)
Albumin: 4.2 g/dL (ref 3.6–5.1)
Alkaline phosphatase (APISO): 101 U/L (ref 37–153)
BUN/Creatinine Ratio: 6 (calc) (ref 6–22)
BUN: 6 mg/dL — ABNORMAL LOW (ref 7–25)
CO2: 27 mmol/L (ref 20–32)
Calcium: 9.3 mg/dL (ref 8.6–10.4)
Chloride: 107 mmol/L (ref 98–110)
Creat: 0.95 mg/dL (ref 0.50–1.05)
Globulin: 2.8 g/dL (ref 1.9–3.7)
Glucose, Bld: 90 mg/dL (ref 65–99)
Potassium: 3.9 mmol/L (ref 3.5–5.3)
Sodium: 143 mmol/L (ref 135–146)
Total Bilirubin: 0.2 mg/dL (ref 0.2–1.2)
Total Protein: 7 g/dL (ref 6.1–8.1)
eGFR: 67 mL/min/{1.73_m2} (ref >=60)

## 2023-12-01 LAB — LIPID PANEL
Cholesterol: 201 mg/dL — ABNORMAL HIGH (ref <200)
HDL: 41 mg/dL — ABNORMAL LOW (ref >=50)
LDL Cholesterol (Calc): 127 mg/dL — ABNORMAL HIGH
Non-HDL Cholesterol (Calc): 160 mg/dL — ABNORMAL HIGH (ref <130)
Total CHOL/HDL Ratio: 4.9 (calc) (ref <5.0)
Triglycerides: 191 mg/dL — ABNORMAL HIGH (ref <150)

## 2023-12-01 LAB — HEMOGLOBIN A1C
Hgb A1c MFr Bld: 6.3 % — ABNORMAL HIGH (ref <5.7)
Mean Plasma Glucose: 134 mg/dL
eAG (mmol/L): 7.4 mmol/L

## 2023-12-03 ENCOUNTER — Ambulatory Visit: Payer: Self-pay | Admitting: Family

## 2023-12-06 ENCOUNTER — Telehealth: Payer: Self-pay

## 2023-12-06 NOTE — Telephone Encounter (Signed)
 Copied from CRM [id number]. Topic: Clinical - Lab/Test Results >> Dec 06, 2023  2:49 PM Jordan Harrell wrote: Reason for CRM: Pt returning Jordan Harrell, CMA call for lab results from 11/30/2023. Pt agreed to cholesterol medication and she wants to hold off on the nutritionist. 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 Is it possible to send orders to outside lab like [facility] to have it done on Saturday? Please contact pt via MyChart.

## 2024-01-03 ENCOUNTER — Ambulatory Visit (HOSPITAL_BASED_OUTPATIENT_CLINIC_OR_DEPARTMENT_OTHER)
Admission: RE | Admit: 2024-01-03 | Discharge: 2024-01-03 | Disposition: A | Discharge status: Home / Self Care | Source: Ambulatory Visit | Attending: Family | Admitting: Family

## 2024-01-03 ENCOUNTER — Encounter (HOSPITAL_BASED_OUTPATIENT_CLINIC_OR_DEPARTMENT_OTHER): Payer: Self-pay

## 2024-01-03 ENCOUNTER — Telehealth: Payer: Self-pay | Admitting: Family

## 2024-01-03 DIAGNOSIS — Z1231 Encounter for screening mammogram for malignant neoplasm of breast: Secondary | ICD-10-CM | POA: Diagnosis present

## 2024-01-03 NOTE — Telephone Encounter (Signed)
 Pt came in to see why pcp was calling regarding her labs. Pt stated every time pcp calls she was not able to answer. Pt asked to be called back any time today pt stated she should be free all day today if possible to return call.

## 2024-01-05 ENCOUNTER — Emergency Department (HOSPITAL_BASED_OUTPATIENT_CLINIC_OR_DEPARTMENT_OTHER)
Admission: EM | Admit: 2024-01-05 | Discharge: 2024-01-05 | Disposition: A | Discharge status: Home / Self Care | Attending: Emergency Medicine | Admitting: Emergency Medicine

## 2024-01-05 ENCOUNTER — Encounter (HOSPITAL_BASED_OUTPATIENT_CLINIC_OR_DEPARTMENT_OTHER): Payer: Self-pay | Admitting: Emergency Medicine

## 2024-01-05 ENCOUNTER — Emergency Department (HOSPITAL_BASED_OUTPATIENT_CLINIC_OR_DEPARTMENT_OTHER)

## 2024-01-05 ENCOUNTER — Other Ambulatory Visit: Payer: Self-pay

## 2024-01-05 DIAGNOSIS — R109 Unspecified abdominal pain: Secondary | ICD-10-CM | POA: Diagnosis present

## 2024-01-05 DIAGNOSIS — N200 Calculus of kidney: Secondary | ICD-10-CM

## 2024-01-05 DIAGNOSIS — N132 Hydronephrosis with renal and ureteral calculous obstruction: Secondary | ICD-10-CM | POA: Insufficient documentation

## 2024-01-05 LAB — CBC WITH DIFFERENTIAL/PLATELET
Abs Immature Granulocytes: 0.03 K/uL (ref 0.00–0.07)
Basophils Absolute: 0 K/uL (ref 0.0–0.1)
Basophils Relative: 1 %
Eosinophils Absolute: 0.1 K/uL (ref 0.0–0.5)
Eosinophils Relative: 1 %
HCT: 30 % — ABNORMAL LOW (ref 36.0–46.0)
Hemoglobin: 9.3 g/dL — ABNORMAL LOW (ref 12.0–15.0)
Immature Granulocytes: 0 %
Lymphocytes Relative: 16 %
Lymphs Abs: 1.2 K/uL (ref 0.7–4.0)
MCH: 23.3 pg — ABNORMAL LOW (ref 26.0–34.0)
MCHC: 31 g/dL (ref 30.0–36.0)
MCV: 75 fL — ABNORMAL LOW (ref 80.0–100.0)
Monocytes Absolute: 0.3 K/uL (ref 0.1–1.0)
Monocytes Relative: 4 %
Neutro Abs: 5.8 K/uL (ref 1.7–7.7)
Neutrophils Relative %: 78 %
Platelets: 341 K/uL (ref 150–400)
RBC: 4 MIL/uL (ref 3.87–5.11)
RDW: 19.3 % — ABNORMAL HIGH (ref 11.5–15.5)
WBC: 7.3 K/uL (ref 4.0–10.5)
nRBC: 0 % (ref 0.0–0.2)

## 2024-01-05 LAB — URINALYSIS, ROUTINE W REFLEX MICROSCOPIC
Bilirubin Urine: NEGATIVE
Glucose, UA: NEGATIVE mg/dL
Ketones, ur: NEGATIVE mg/dL
Leukocytes,Ua: NEGATIVE
Nitrite: NEGATIVE
Protein, ur: 30 mg/dL — AB
Specific Gravity, Urine: >=1.03 (ref 1.005–1.030)
pH: 5.5 (ref 5.0–8.0)

## 2024-01-05 LAB — LIPASE, BLOOD: Lipase: 39 U/L (ref 11–51)

## 2024-01-05 LAB — COMPREHENSIVE METABOLIC PANEL WITH GFR
ALT: 13 U/L (ref 0–44)
AST: 21 U/L (ref 15–41)
Albumin: 4.3 g/dL (ref 3.5–5.0)
Alkaline Phosphatase: 100 U/L (ref 38–126)
Anion gap: 13 (ref 5–15)
BUN: 7 mg/dL — ABNORMAL LOW (ref 8–23)
CO2: 22 mmol/L (ref 22–32)
Calcium: 9.3 mg/dL (ref 8.9–10.3)
Chloride: 106 mmol/L (ref 98–111)
Creatinine, Ser: 0.81 mg/dL (ref 0.44–1.00)
GFR, Estimated: >60 mL/min (ref >60)
Glucose, Bld: 132 mg/dL — ABNORMAL HIGH (ref 70–99)
Potassium: 3.6 mmol/L (ref 3.5–5.1)
Sodium: 141 mmol/L (ref 135–145)
Total Bilirubin: <0.2 mg/dL (ref 0.0–1.2)
Total Protein: 7.3 g/dL (ref 6.5–8.1)

## 2024-01-05 LAB — URINALYSIS, MICROSCOPIC (REFLEX): RBC / HPF: >50 RBC/hpf (ref 0–5)

## 2024-01-05 MED ORDER — SODIUM CHLORIDE 0.9 % IV BOLUS
1000.0000 mL | Freq: Once | INTRAVENOUS | Status: AC
  Administered 2024-01-05: 1000 mL via INTRAVENOUS

## 2024-01-05 MED ORDER — DIPHENHYDRAMINE HCL 50 MG/ML IJ SOLN
12.5000 mg | Freq: Once | INTRAMUSCULAR | Status: AC
  Administered 2024-01-05: 12.5 mg via INTRAVENOUS
  Filled 2024-01-05: qty 1

## 2024-01-05 MED ORDER — TAMSULOSIN HCL 0.4 MG PO CAPS: 0.4000 mg | ORAL_CAPSULE | Freq: Every day | ORAL | 0 refills | Status: AC

## 2024-01-05 MED ORDER — PROCHLORPERAZINE EDISYLATE 10 MG/2ML IJ SOLN
5.0000 mg | Freq: Once | INTRAMUSCULAR | Status: AC
  Administered 2024-01-05: 5 mg via INTRAVENOUS
  Filled 2024-01-05: qty 2

## 2024-01-05 MED ORDER — MORPHINE SULFATE (PF) 4 MG/ML IV SOLN
4.0000 mg | Freq: Once | INTRAVENOUS | Status: AC
  Administered 2024-01-05: 4 mg via INTRAVENOUS
  Filled 2024-01-05: qty 1

## 2024-01-05 MED ORDER — ONDANSETRON HCL 4 MG/2ML IJ SOLN
4.0000 mg | Freq: Once | INTRAMUSCULAR | Status: AC
  Administered 2024-01-05: 4 mg via INTRAVENOUS
  Filled 2024-01-05: qty 2

## 2024-01-05 MED ORDER — KETOROLAC TROMETHAMINE 15 MG/ML IJ SOLN
15.0000 mg | Freq: Once | INTRAMUSCULAR | Status: DC
  Filled 2024-01-05: qty 1

## 2024-01-05 MED ORDER — OXYCODONE HCL 5 MG PO TABS: 5.0000 mg | ORAL_TABLET | Freq: Four times a day (QID) | ORAL | 0 refills | Status: DC | PRN

## 2024-01-05 MED ORDER — FENTANYL CITRATE PF 50 MCG/ML IJ SOSY
50.0000 ug | PREFILLED_SYRINGE | Freq: Once | INTRAMUSCULAR | Status: AC
  Administered 2024-01-05: 50 ug via INTRAVENOUS
  Filled 2024-01-05: qty 1

## 2024-01-05 MED ORDER — ONDANSETRON 4 MG PO TBDP: 4.0000 mg | ORAL_TABLET | Freq: Three times a day (TID) | ORAL | 0 refills | Status: AC | PRN

## 2024-01-05 MED ORDER — KETOROLAC TROMETHAMINE 30 MG/ML IJ SOLN
30.0000 mg | Freq: Once | INTRAMUSCULAR | Status: AC
  Administered 2024-01-05: 30 mg via INTRAVENOUS
  Filled 2024-01-05: qty 1

## 2024-01-05 MED ORDER — IOHEXOL 300 MG/ML  SOLN
100.0000 mL | Freq: Once | INTRAMUSCULAR | Status: AC | PRN
  Administered 2024-01-05: 100 mL via INTRAVENOUS

## 2024-01-05 NOTE — ED Notes (Addendum)
 Family came out asking us  to give her something to knock her out. Informed her the patient has received a significant amount of medicine and is nearly sedated already, and is much improved compared to initial presentations. Meds withheld at this time due to airway safety and prevention of degradation.   P.S. This family member just came in to the room and was not here for the initial treatments over the last 2 hours.

## 2024-01-05 NOTE — ED Notes (Signed)

## 2024-01-05 NOTE — ED Notes (Signed)
 ED Provider at bedside.

## 2024-01-05 NOTE — ED Provider Notes (Signed)
  EMERGENCY DEPARTMENT AT [facility] Provider Note   CSN: [id number] Arrival date & time: 01/05/24  9168     Patient presents with: Abdominal Pain   Jordan Harrell is a 63 y.o. female.  {Add pertinent medical, surgical, social history, OB history to YEP:[id number]} Patient woke up this morning with right-sided abdominal pain.  History of hysterectomy.  She denies any fever.  She has had some nausea vomiting and does not know if she has had any diarrhea.  No history of kidney stones.  No pain with urination.  Denies any suspicious food intake.  No chest pain or shortness of breath no cough no sputum production.  The history is provided by the patient.       Prior to Admission medications   Medication Sig Start Date End Date Taking? Authorizing Provider  ondansetron  (ZOFRAN -ODT) 4 MG disintegrating tablet Take 1 tablet (4 mg total) by mouth every 8 (eight) hours as needed. 01/05/24  Yes Teeghan Hammer, DO  oxyCODONE  (ROXICODONE ) 5 MG immediate release tablet Take 1 tablet (5 mg total) by mouth every 6 (six) hours as needed for up to 20 doses. 01/05/24  Yes Akari Crysler, DO  tamsulosin  (FLOMAX ) 0.4 MG CAPS capsule Take 1 capsule (0.4 mg total) by mouth daily for 7 days. 01/05/24 01/12/24 Yes Tais Koestner, DO    Allergies: Amoxicillin    Review of Systems  Updated Vital Signs BP (!) 170/82   Pulse 79   Temp 98.6 F (37 C)   Resp 20   SpO2 96%   Physical Exam Vitals and nursing note reviewed.  Constitutional:      General: She is not in acute distress.    Appearance: She is well-developed. She is not ill-appearing.  HENT:     Head: Normocephalic and atraumatic.     Nose: Nose normal.     Mouth/Throat:     Mouth: Mucous membranes are moist.  Eyes:     Extraocular Movements: Extraocular movements intact.     Conjunctiva/sclera: Conjunctivae normal.     Pupils: Pupils are equal, round, and reactive to light.  Cardiovascular:     Rate and Rhythm: Normal  rate and regular rhythm.     Pulses: Normal pulses.     Heart sounds: Normal heart sounds. No murmur heard. Pulmonary:     Effort: Pulmonary effort is normal. No respiratory distress.     Breath sounds: Normal breath sounds.  Abdominal:     Palpations: Abdomen is soft.     Tenderness: There is abdominal tenderness in the right lower quadrant. There is right CVA tenderness.  Musculoskeletal:        General: No swelling.     Cervical back: Neck supple.  Skin:    General: Skin is warm and dry.     Capillary Refill: Capillary refill takes less than 2 seconds.  Neurological:     Mental Status: She is alert.  Psychiatric:        Mood and Affect: Mood normal.     (all labs ordered are listed, but only abnormal results are displayed) Labs Reviewed  CBC WITH DIFFERENTIAL/PLATELET - Abnormal; Notable for the following components:      Result Value   Hemoglobin 9.3 (*)    HCT 30.0 (*)    MCV 75.0 (*)    MCH 23.3 (*)    RDW 19.3 (*)    All other components within normal limits  COMPREHENSIVE METABOLIC PANEL WITH GFR - Abnormal; Notable for the  following components:   Glucose, Bld 132 (*)    BUN 7 (*)    All other components within normal limits  URINALYSIS, ROUTINE W REFLEX MICROSCOPIC - Abnormal; Notable for the following components:   Color, Urine BROWN (*)    APPearance CLOUDY (*)    Hgb urine dipstick LARGE (*)    Protein, ur 30 (*)    All other components within normal limits  URINALYSIS, MICROSCOPIC (REFLEX) - Abnormal; Notable for the following components:   Bacteria, UA FEW (*)    All other components within normal limits  LIPASE, BLOOD    EKG: None  Radiology: CT ABDOMEN PELVIS W CONTRAST Result Date: 01/05/2024 CLINICAL DATA:  Lower right abdominal pain with vomiting. Right lower quadrant pain. EXAM: CT ABDOMEN AND PELVIS WITH CONTRAST TECHNIQUE: Multidetector CT imaging of the abdomen and pelvis was performed using the standard protocol following bolus  administration of intravenous contrast. RADIATION DOSE REDUCTION: This exam was performed according to the departmental dose-optimization program which includes automated exposure control, adjustment of the mA and/or kV according to patient size and/or use of iterative reconstruction technique. CONTRAST:  OMNIPAQUE  IOHEXOL  300 MG/ML  SOLN COMPARISON:  03/05/2019 FINDINGS: Lower chest: No acute findings. Hepatobiliary: No suspicious focal abnormality within the liver parenchyma. There is no evidence for gallstones, gallbladder wall thickening, or pericholecystic fluid. No intrahepatic or extrahepatic biliary dilation. Pancreas: No focal mass lesion. No dilatation of the main duct. No intraparenchymal cyst. No peripancreatic edema. Spleen: No splenomegaly. No suspicious focal mass lesion. Adrenals/Urinary Tract: No adrenal nodule or mass. Left kidney and ureter unremarkable. There is mild to moderate right hydroureteronephrosis with decreased perfusion to the right kidney. 3 mm right UVJ stone visible on axial 74/2 and sagittal 92/9. Bladder otherwise unremarkable. Stomach/Bowel: Tiny hiatal hernia. Stomach is unremarkable. No gastric wall thickening. No evidence of outlet obstruction. Duodenum is normally positioned as is the ligament of Treitz. No small bowel wall thickening. No small bowel dilatation. The terminal ileum is normal. The appendix is normal. Colon is diffusely decompressed which accentuates wall thickness. Vascular/Lymphatic: There is mild atherosclerotic calcification of the abdominal aorta without aneurysm. There is no gastrohepatic or hepatoduodenal ligament lymphadenopathy. No retroperitoneal or mesenteric lymphadenopathy. No pelvic sidewall lymphadenopathy. Reproductive: There is no adnexal mass. Other: No intraperitoneal free fluid. Musculoskeletal: No worrisome lytic or sclerotic osseous abnormality. IMPRESSION: 1. 3 mm right UVJ stone with mild to moderate right hydroureteronephrosis. No  other urinary stone disease evident. 2. Tiny hiatal hernia. 3.  Aortic Atherosclerosis (ICD10-I70.0). Electronically Signed   By: Camellia Candle M.D.   On: 01/05/2024 10:38    {Document cardiac monitor, telemetry assessment procedure when appropriate:32947} Procedures   Medications Ordered in the ED  morphine  (PF) 4 MG/ML injection 4 mg (0 mg Intravenous Hold 01/05/24 1030)  ketorolac  (TORADOL ) 15 MG/ML injection 15 mg (has no administration in time range)  sodium chloride  0.9 % bolus 1,000 mL (1,000 mLs Intravenous New Bag/Given 01/05/24 0910)  ondansetron  (ZOFRAN ) injection 4 mg (4 mg Intravenous Given 01/05/24 0908)  fentaNYL  (SUBLIMAZE ) injection 50 mcg (50 mcg Intravenous Given 01/05/24 0908)  morphine  (PF) 4 MG/ML injection 4 mg (4 mg Intravenous Given 01/05/24 0938)  prochlorperazine  (COMPAZINE ) injection 5 mg (5 mg Intravenous Given 01/05/24 0947)  diphenhydrAMINE  (BENADRYL ) injection 12.5 mg (12.5 mg Intravenous Given 01/05/24 0947)  iohexol  (OMNIPAQUE ) 300 MG/ML solution 100 mL (100 mLs Intravenous Contrast Given 01/05/24 0956)      {Click here for ABCD2, HEART and other calculators REFRESH Note  before signing:1}                              Medical Decision Making Amount and/or Complexity of Data Reviewed Labs: ordered. Radiology: ordered.  Risk Prescription drug management.   Alexee Delsanto is here right-sided abdominal pain normal vitals.  No fever.  History of hysterectomy.  Differential diagnosis colitis versus viral process versus cholecystitis appendicitis bowel obstruction other intra-abdominal process.  Will get CBC CMP lipase urinalysis CT scan abdomen and pelvis.  Will give IV fluids IV Zofran  IV fentanyl  and reevaluate.  Per my review interpretation of labs no significant leukocytosis anemia or electrolyte abnormality.  Urinalysis negative for infection.  CT scan shows 3 mm right UVJ stone.  Overall she is required multiple rounds of IV narcotics but she has improved.   Have given her IV Toradol .  Will prescribe her oxycodone , Zofran , Flomax .  Understands return precautions could fever, worsening pain.  Will have her follow-up with urology.  Discharged in good condition.  This chart was dictated using voice recognition software.  Despite best efforts to proofread,  errors can occur which can change the documentation meaning.   {Document critical care time when appropriate  Document review of labs and clinical decision tools ie CHADS2VASC2, etc  Document your independent review of radiology images and any outside records  Document your discussion with family members, caretakers and with consultants  Document social determinants of health affecting pt's care  Document your decision making why or why not admission, treatments were needed:32947:::1}   Final diagnoses:  Kidney stone    ED Discharge Orders          Ordered    oxyCODONE  (ROXICODONE ) 5 MG immediate release tablet  Every 6 hours PRN        01/05/24 1045    ondansetron  (ZOFRAN -ODT) 4 MG disintegrating tablet  Every 8 hours PRN        01/05/24 1045    tamsulosin  (FLOMAX ) 0.4 MG CAPS capsule  Daily        01/05/24 1045

## 2024-01-05 NOTE — ED Notes (Signed)
 Pt on bedside commode with family to assist. Have more meds for when she's finished.

## 2024-01-05 NOTE — Discharge Instructions (Addendum)
 Recommend taking 400 mg ibuprofen  every 8 hours as needed for pain.  Recommend 1000 mg of Tylenol  every 6 hours as needed for pain.  Have written you for a narcotic pain medicine called Roxicodone  for breakthrough pain.  Take as prescribed.  I have given you medicine for nausea called Zofran .  Take as needed.  Have also given you a medicine called Flomax  to help with kidney stone as well.  If you develop a fever greater than 100.4 or worsening pain I do suggest she return to [facility] for evaluation as our urology team is.

## 2024-01-05 NOTE — ED Triage Notes (Signed)
 RLQ pain this am, with vomiting. Appears distressed

## 2024-01-08 NOTE — Telephone Encounter (Signed)
 Chart reviewed, unsure what this was regarding. Last labs were discussed from 11/30/23.

## 2024-02-08 DIAGNOSIS — D649 Anemia, unspecified: Secondary | ICD-10-CM | POA: Insufficient documentation

## 2024-02-08 DIAGNOSIS — K5900 Constipation, unspecified: Secondary | ICD-10-CM | POA: Insufficient documentation

## 2024-02-08 DIAGNOSIS — N83209 Unspecified ovarian cyst, unspecified side: Secondary | ICD-10-CM | POA: Insufficient documentation

## 2024-02-08 DIAGNOSIS — Z86018 Personal history of other benign neoplasm: Secondary | ICD-10-CM | POA: Insufficient documentation

## 2024-02-12 ENCOUNTER — Ambulatory Visit

## 2024-02-12 VITALS — BP 144/92 | HR 96 | Ht 64.0 in | Wt 196.1 lb

## 2024-02-12 DIAGNOSIS — R030 Elevated blood-pressure reading, without diagnosis of hypertension: Secondary | ICD-10-CM | POA: Insufficient documentation

## 2024-02-12 DIAGNOSIS — Z720 Tobacco use: Secondary | ICD-10-CM

## 2024-02-12 HISTORY — DX: Elevated blood-pressure reading, without diagnosis of hypertension: R03.0

## 2024-02-12 NOTE — Assessment & Plan Note (Signed)
 Advised only about quitting smoking given the harmful effects.  She acknowledges and understands the importance of quitting mentions it has been a difficult habit but will work on it.

## 2024-02-12 NOTE — Patient Instructions (Signed)
 Medication Instructions:  Your physician recommends that you continue on your current medications as directed. Please refer to the Current Medication list given to you today.  *If you need a refill on your cardiac medications before your next appointment, please call your pharmacy*  Lab Work: None If you have labs (blood work) drawn today and your tests are completely normal, you will receive your results only by: MyChart Message (if you have MyChart) OR A paper copy in the mail If you have any lab test that is abnormal or we need to change your treatment, we will call you to review the results.  Testing/Procedures: Your physician has requested that you have an echocardiogram. Echocardiography is a painless test that uses sound waves to create images of your heart. It provides your doctor with information about the size and shape of your heart and how well your heart's chambers and valves are working. This procedure takes approximately one hour. There are no restrictions for this procedure. Please do NOT wear cologne, perfume, aftershave, or lotions (deodorant is allowed). Please arrive 15 minutes prior to your appointment time.  Please note: We ask at that you not bring children with you during ultrasound (echo/ vascular) testing. Due to room size and safety concerns, children are not allowed in the ultrasound rooms during exams. Our front office staff cannot provide observation of children in our lobby area while testing is being conducted. An adult accompanying a patient to their appointment will only be allowed in the ultrasound room at the discretion of the ultrasound technician under special circumstances. We apologize for any inconvenience.   Follow-Up: At [facility], you and your health needs are our priority.  As part of our continuing mission to provide you with exceptional heart care, our providers are all part of one team.  This team includes your primary Cardiologist  (physician) and Advanced Practice Providers or APPs (Physician Assistants and Nurse Practitioners) who all work together to provide you with the care you need, when you need it.  Your next appointment:   3 month(s)  Provider:   Bertha Broad, MD    We recommend signing up for the patient portal called "MyChart".  Sign up information is provided on this After Visit Summary.  MyChart is used to connect with patients for Virtual Visits (Telemedicine).  Patients are able to view lab/test results, encounter notes, upcoming appointments, etc.  Non-urgent messages can be sent to your provider as well.   To learn more about what you can do with MyChart, go to [email or web address].   Other Instructions Please keep a BP log for 2 weeks and send by MyChart or mail.                         Name and DOB__________________________ Dr. Madireddy [address] [place], [place] [address]  Blood Pressure Record Sheet To take your blood pressure, you will need a blood pressure machine. You can buy a blood pressure machine (blood pressure monitor) at your clinic, drug store, or online. When choosing one, consider: An automatic monitor that has an arm cuff. A cuff that wraps snugly around your upper arm. You should be able to fit only one finger between your arm and the cuff. A device that stores blood pressure reading results. Do not choose a monitor that measures your blood pressure from your wrist or finger. Follow your health care provider's instructions for how to take your blood pressure. To use this  form: Get one reading in the morning (a.m.) 1-2 hours after you take any medicines. Get one reading in the evening (p.m.) before supper.   Blood pressure log Date: _______________________  a.m. _____________________(1st reading) HR___________            p.m. _____________________(2nd reading) HR__________  Date: _______________________  a.m. _____________________(1st reading) HR___________             p.m. _____________________(2nd reading) HR__________  Date: _______________________  a.m. _____________________(1st reading) HR___________            p.m. _____________________(2nd reading) HR__________  Date: _______________________  a.m. _____________________(1st reading) HR___________            p.m. _____________________(2nd reading) HR__________  Date: _______________________  a.m. _____________________(1st reading) HR___________            p.m. _____________________(2nd reading) HR__________  Date: _______________________  a.m. _____________________(1st reading) HR___________            p.m. _____________________(2nd reading) HR__________  Date: _______________________  a.m. _____________________(1st reading) HR___________            p.m. _____________________(2nd reading) HR__________   This information is not intended to replace advice given to you by your health care provider. Make sure you discuss any questions you have with your health care provider. Document Revised: 09/17/2019 Document Reviewed: 09/17/2019 Elsevier Patient Education  2021 [employer].
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
## 2024-02-12 NOTE — Assessment & Plan Note (Signed)
 Mentions she does not have consistently elevated blood pressures and readings systolic at home typically in 869d and diastolic in 80s or below.  Advised her to check her blood pressures consistently at home for the next couple weeks and have the device validated at PCPs office. If the readings are consistently above 130/80 mmHg would recommend antihypertensive therapy with medications.  Various causes of elevated blood pressures reviewed. Importance of monitoring blood pressures and treating high blood pressure reviewed and recommended. Risks of long-term health issues from untreated hypertension discussed. She is agreeable to monitor.  Will assess with transthoracic echocardiogram to rule out any significant longstanding high blood pressure related changes such as left ventricular hypertrophy or diastolic dysfunction.

## 2024-02-19 ENCOUNTER — Other Ambulatory Visit: Payer: Self-pay

## 2024-02-19 DIAGNOSIS — R030 Elevated blood-pressure reading, without diagnosis of hypertension: Secondary | ICD-10-CM

## 2024-02-19 DIAGNOSIS — Z720 Tobacco use: Secondary | ICD-10-CM

## 2024-02-19 DIAGNOSIS — R9431 Abnormal electrocardiogram [ECG] [EKG]: Secondary | ICD-10-CM

## 2024-03-10 ENCOUNTER — Ambulatory Visit (HOSPITAL_BASED_OUTPATIENT_CLINIC_OR_DEPARTMENT_OTHER)

## 2024-03-13 ENCOUNTER — Ambulatory Visit: Payer: Self-pay

## 2024-03-13 NOTE — Telephone Encounter (Signed)
 FYI Only or Action Required?: FYI only for provider.  Patient was last seen in primary care on 11/30/2023 by Jason Leita Repine, FNP.  Called Nurse Triage reporting Breast Mass.  Symptoms began several days ago.  Interventions attempted: Ice/heat application.  Symptoms are: unchanged.  Triage Disposition: See PCP When Office is Open (Within 3 Days)  Patient/caregiver understands and will follow disposition?: Yes Reason for Disposition  Breast lump  Answer Assessment - Initial Assessment Questions Patient states she had an unremarkable mammogram 01/03/24.  1. SYMPTOM: What's the main symptom you're concerned about?  (e.g., lump, nipple discharge, pain, rash)     Lump on left breast close proximity to nipple  2. LOCATION:      Left breast  3. ONSET:     03/08/24  4. PRIOR HISTORY: Do you have any history of prior problems with your breasts? (e.g., breast cancer, breast implant, fibrocystic breast disease)     Denies  5. CAUSE: What do you think is causing this symptom?     Unknown  6. OTHER SYMPTOMS: Do you have any other symptoms? (e.g., breast pain, fever, nipple discharge, redness or rash)     Tender to the touch. Denied other symptoms.  Protocols used: Breast Symptoms-A-AH Copied from CRM [id number]. Topic: Clinical - Red Word Triage >> Mar 13, 2024 11:38 AM Maisie BROCKS wrote: Red Word that prompted transfer to Nurse Triage: Severe pain on left nipple, appears to be a cyst, classified pain 9/10, getting worse.

## 2024-03-14 ENCOUNTER — Encounter: Payer: Self-pay | Admitting: Student

## 2024-03-14 ENCOUNTER — Ambulatory Visit: Admitting: Student

## 2024-03-14 ENCOUNTER — Telehealth: Payer: Self-pay | Admitting: Family

## 2024-03-14 VITALS — BP 175/76 | HR 86 | Temp 98.4°F | Ht 64.0 in | Wt 197.4 lb

## 2024-03-14 DIAGNOSIS — L0390 Cellulitis, unspecified: Secondary | ICD-10-CM

## 2024-03-14 DIAGNOSIS — N61 Mastitis without abscess: Secondary | ICD-10-CM

## 2024-03-14 MED ORDER — CEPHALEXIN 500 MG PO CAPS: 500.0000 mg | ORAL_CAPSULE | Freq: Three times a day (TID) | ORAL | 0 refills | Status: AC

## 2024-03-14 NOTE — Telephone Encounter (Signed)
 Pt states that Jordan Harrell said she has high cholesterol but never followed up on it. she was wondering if she was there was anything she needs to do about it.

## 2024-03-14 NOTE — Progress Notes (Addendum)
 Acute Office Visit  Subjective:     Patient ID: Jordan Harrell, female    DOB: [birth year], 63 y.o.   MRN: [id number]  No chief complaint on file.   HPI Patient is in today for acute visit with concerns about possible breast lump.  History of Present Illness Jordan Harrell is a 63 year old female who presents with a painful area in her left breast. She noticed the swelling on Monday morning, initially attributing the soreness to her sleeping position. The soreness increased throughout the day, leading her to apply a hot towel and peroxide, which provided some relief. By Tuesday, she felt a soft spot near the nipple, described as very painful even without touch, and rates the pain as severe. She denies any nipple discharge but notes swelling of the nipple. No nipple inversion, changes in the skin other than swelling, or trouble breathing. Her mother had double breast cancer. She has had cysts in other areas of her body, including one between her breasts that required surgical removal. She recently had a mammogram in July 2025 that was normal.     Patient denies fever, chills, SOB, CP, palpitations, dyspnea, edema, HA, vision changes, N/V/D, abdominal pain, urinary symptoms, rash, weight changes, and recent illness or hospitalizations.   ROS  See HPI    Objective:    BP (!) 175/76   Pulse 86   Temp 98.4 F (36.9 C)   Ht 5' 4 (1.626 m)   Wt 197 lb 6.4 oz (89.5 kg)   SpO2 100%   BMI 33.88 kg/m    Physical Exam  General: No acute distress. Awake and conversant.  Respiratory: CTAB. Respirations are non-labored. No wheezing.  Skin: Warm. No rashes or ulcers.  Left breast: erythema surrounding nipple, nipple swelling, TWP. No nipple discharge. Unable to deeply palpate L breast r/t severe tenderness. Right breast: WNL. NTWP,no masses, skin changes, nipple discharge, or tenderness. Psych: Alert and oriented. Cooperative, Appropriate mood and affect, Normal judgment.  CV:  RRR. No murmur. No lower extremity edema.  MSK: Normal ambulation. No clubbing or cyanosis.  Neuro:  CN II-XII grossly normal.    No results found for any visits on 03/14/24.      Assessment & Plan:   Problem List Items Addressed This Visit   None Visit Diagnoses       Cellulitis, unspecified cellulitis site    -  Primary   Relevant Medications   cephALEXin (KEFLEX) 500 MG capsule     Infection of left breast       Relevant Medications   cephALEXin (KEFLEX) 500 MG capsule      Cellulitis of right breast, periareolar region Acute cellulitis likely due to staphylococcal infection. Low likelihood of cross-reactivity with Cefalexin despite amoxicillin allergy, Pt provided education on this. Explained importance of completing antibiotics and monitoring for improvement.  - Rx- Keflex - Advise to monitor for improvement by day 4 or 5 and report if no improvement. - Instruct to complete the antibiotic course. - Advise to avoid irritating or excessively touching the area. Do NOT use hydrogen peroxide, use only mild antibacterial soap to cleanse.  -If symptoms persist, consider repeat Mammogram.  - Pt to RTC if symptoms persist or worsen.  Meds ordered this encounter  Medications   cephALEXin (KEFLEX) 500 MG capsule    Sig: Take 1 capsule (500 mg total) by mouth 3 (three) times daily for 7 days.    Dispense:  21 capsule    Refill:  0  Supervising Provider:   DOMENICA BLACKBIRD A [[id number]]    No follow-ups on file.  [name], NP

## 2024-03-17 ENCOUNTER — Other Ambulatory Visit: Payer: Self-pay | Admitting: Family

## 2024-03-17 MED ORDER — SIMVASTATIN 10 MG PO TABS: 10.0000 mg | ORAL_TABLET | Freq: Every day | ORAL | 1 refills | Status: DC

## 2024-03-17 NOTE — Telephone Encounter (Signed)
 LMOM notifying pt rx and advised pt to schedule lab appt in 6-8 weeks.

## 2024-03-18 ENCOUNTER — Encounter (HOSPITAL_COMMUNITY): Payer: Self-pay

## 2024-04-22 ENCOUNTER — Other Ambulatory Visit: Payer: Self-pay | Admitting: *Deleted

## 2024-04-22 ENCOUNTER — Telehealth: Payer: Self-pay | Admitting: Student

## 2024-04-22 DIAGNOSIS — Z13220 Encounter for screening for lipoid disorders: Secondary | ICD-10-CM

## 2024-04-22 NOTE — Telephone Encounter (Signed)
 Orders placed.

## 2024-04-22 NOTE — Telephone Encounter (Signed)
 This pt is on the schedule for next Friday but she doesn't have any orders

## 2024-04-29 ENCOUNTER — Telehealth: Payer: Self-pay

## 2024-04-29 NOTE — Telephone Encounter (Signed)
 Copied from CRM [id number]. Topic: Clinical - Prescription Issue >> Apr 29, 2024  1:50 PM Aleatha C wrote: Reason for CRM: Patient would like to talk to Nurse Leita about medication since taking it arm always falls asleep simvastatin (ZOCOR) 10 MG tablet and to give a call to further to discuss it and [facility] was trying to fill the prescription but needs approval, patient would like something else

## 2024-05-01 NOTE — Telephone Encounter (Signed)
 Mychart message sent to pt.

## 2024-05-02 ENCOUNTER — Other Ambulatory Visit

## 2024-05-14 ENCOUNTER — Ambulatory Visit

## 2024-06-09 DIAGNOSIS — E782 Mixed hyperlipidemia: Secondary | ICD-10-CM

## 2024-06-09 DIAGNOSIS — R7303 Prediabetes: Secondary | ICD-10-CM | POA: Insufficient documentation

## 2024-06-09 HISTORY — DX: Prediabetes: R73.03

## 2024-06-09 HISTORY — DX: Mixed hyperlipidemia: E78.2

## 2024-06-09 NOTE — Assessment & Plan Note (Deleted)
 Advised only about quitting smoking given the harmful effects. Pt declines at this time.

## 2024-06-09 NOTE — Assessment & Plan Note (Deleted)
 [id number] acceptable, minimize simple carbs. Increase exercise as tolerated. Continue current meds

## 2024-06-09 NOTE — Progress Notes (Deleted)
 "  Subjective:     Patient ID: Jordan Harrell, female    DOB: 1960-10-14, 62 y.o.   MRN: [id number]  No chief complaint on file.   HPI  Discussed the use of AI scribe software for clinical note transcription with the patient, who gave verbal consent to proceed.   Following with: Cardiology  HLD- simvastatin  (ZOCOR ) 10 mg daily  HCM CRC screen: due Mgm: Last 12/2023, repeat 1 year Pap: Hx HPV Immunizations: Covid, PNA, Shingles, Influenza due  Patient denies fever, chills, SOB, CP, palpitations, dyspnea, edema, HA, vision changes, N/V/D, abdominal pain, urinary symptoms, rash, weight changes, and recent illness or hospitalizations.   History of Present Illness              Health Maintenance Due  Topic Date Due   HIV Screening  Never done   Hepatitis C Screening  Never done   Pneumococcal Vaccine: 50+ Years (1 of 2 - PCV) Never done   Cervical Cancer Screening (HPV/Pap Cotest)  Never done   Colonoscopy  Never done   Zoster Vaccines- Shingrix (1 of 2) Never done   Influenza Vaccine  Never done   COVID-19 Vaccine (5 - 2025-26 season) 02/11/2024    Past Medical History:  Diagnosis Date   Anemia    Boil of buttock 04/07/2019   Constipation    History of MI (myocardial infarction) 03/17/2019   History of uterine fibroid    Obesity (BMI 35.0-39.9 without comorbidity) 03/17/2019   Ovarian cyst    Pelvic adhesions    Pelvic mass 03/17/2019   Tobacco abuse 03/17/2019    Past Surgical History:  Procedure Laterality Date   BLADDER SUSPENSION  1979   in [place]    via laparotomy   CESAREAN SECTION  x2  last one 1980s   LAPAROTOMY N/A 04/07/2019   Procedure: MINI-LAPAROTOMY;  Surgeon: Eloy Herring, MD;  Location: [facility];  Service: Gynecology;  Laterality: N/A;   ROBOTIC ASSISTED BILATERAL SALPINGO OOPHERECTOMY N/A 04/07/2019   Procedure: XI ROBOTIC ASSISTED BILATERAL LAPAROSCOPIC  SALPINGO OOPHORECTOMY WITH LYSIS OF ADHESIONS;  Surgeon:  Eloy Herring, MD;  Location: [facility] [place];  Service: Gynecology;  Laterality: N/A;   SUPRACERVICAL ABDOMINAL HYSTERECTOMY  08-18-2003   @[facility]     Family History  Problem Relation Age of Onset   Cancer Mother    Hypertension Mother    Hypertension Father    Hypertension Sister    Diabetes Sister    Hypertension Brother    Diabetes Brother     Social History   Socioeconomic History   Marital status: Single    Spouse name: Not on file   Number of children: Not on file   Years of education: Not on file   Highest education level: Not on file  Occupational History   Not on file  Tobacco Use   Smoking status: Every Day    Current packs/day: 0.50    Average packs/day: 0.5 packs/day for 20.0 years (10.0 ttl pk-yrs)    Types: Cigarettes   Smokeless tobacco: Never  Vaping Use   Vaping status: Never Used  Substance and Sexual Activity   Alcohol use: No   Drug use: Never   Sexual activity: Not on file  Other Topics Concern   Not on file  Social History Narrative   Not on file   Social Drivers of Health   Tobacco Use: High Risk (03/14/2024)   Patient History    Smoking Tobacco Use: Every Day  Smokeless Tobacco Use: Never    Passive Exposure: Not on file  Financial Resource Strain: Not on file  Food Insecurity: Not on file  Transportation Needs: Not on file  Physical Activity: Not on file  Stress: Not on file  Social Connections: Not on file  Intimate Partner Violence: Not on file  Depression (PHQ2-9): Low Risk (11/30/2023)   Depression (PHQ2-9)    PHQ-2 Score: 0  Alcohol Screen: Not on file  Housing: Not on file  Utilities: Not on file  Health Literacy: Not on file    Outpatient Medications Prior to Visit  Medication Sig Dispense Refill   ondansetron  (ZOFRAN -ODT) 4 MG disintegrating tablet Take 1 tablet (4 mg total) by mouth every 8 (eight) hours as needed. 20 tablet 0   simvastatin  (ZOCOR ) 10 MG tablet TAKE 1 TABLET(10 MG) BY MOUTH AT BEDTIME 90  tablet 1   No facility-administered medications prior to visit.    Allergies[1]  ROS     Objective:    Physical Exam   There were no vitals taken for this visit. Wt Readings from Last 3 Encounters:  03/14/24 197 lb 6.4 oz (89.5 kg)  02/12/24 196 lb 1.3 oz (88.9 kg)  11/30/23 204 lb 12.8 oz (92.9 kg)       Assessment & Plan:   Problem List Items Addressed This Visit       Other   Anemia   History of MI (myocardial infarction) - Primary   Tobacco abuse    I am having Jordan Harrell maintain her ondansetron  and simvastatin .  No orders of the defined types were placed in this encounter.     [1]  Allergies Allergen Reactions   Amoxicillin Hives   "

## 2024-06-13 ENCOUNTER — Encounter: Admitting: Student

## 2024-06-25 ENCOUNTER — Ambulatory Visit

## 2024-06-25 ENCOUNTER — Telehealth: Payer: Self-pay

## 2024-06-25 NOTE — Telephone Encounter (Signed)
 Left VM to reschedule appointment with Dr. Liborio that was missed on 1/14 and also her echo that she missed back in September 2025. CB

## 2024-06-25 NOTE — Progress Notes (Unsigned)
 "  Cardiology Consultation:    Date:  06/25/2024   ID:  Jordan Harrell, DOB February 22, 1961, MRN [id number]  PCP:  Jason Leita Repine, FNP (Inactive)  Cardiologist:  Alean SAUNDERS Madellyn Denio, MD   Referring MD: Jason Leita Repine, FNP   No chief complaint on file.    ASSESSMENT AND PLAN:   [name] 64 year old woman Problem List Items Addressed This Visit   None     History of Present Illness:    Jordan Harrell is a 64 y.o. female who is being seen today for follow-up visit. PCP is Jason Leita Repine, FNP. Last visit with me in the office with 02/12/2024.  History of anemia, hypertension, tobacco use [half pack a day] reportedly last stress test in 2023 at her job was unremarkable.  Seen for elevated blood pressures. Echocardiogram was recommended along with home log of blood pressure readings. Both have not been done, not available for review at this time.  Past Medical History:  Diagnosis Date   Anemia    Boil of buttock 04/07/2019   Constipation    Elevated blood pressure reading 02/12/2024   History of MI (myocardial infarction) 03/17/2019   History of uterine fibroid    Mixed hyperlipidemia 06/09/2024   Obesity (BMI 35.0-39.9 without comorbidity) 03/17/2019   Ovarian cyst    Pelvic adhesions    Pelvic mass 03/17/2019   Prediabetes 06/09/2024   Tobacco abuse 03/17/2019    Past Surgical History:  Procedure Laterality Date   BLADDER SUSPENSION  1979   in [place]    via laparotomy   CESAREAN SECTION  x2  last one 1980s   LAPAROTOMY N/A 04/07/2019   Procedure: MINI-LAPAROTOMY;  Surgeon: Eloy Herring, MD;  Location: [facility];  Service: Gynecology;  Laterality: N/A;   ROBOTIC ASSISTED BILATERAL SALPINGO OOPHERECTOMY N/A 04/07/2019   Procedure: XI ROBOTIC ASSISTED BILATERAL LAPAROSCOPIC  SALPINGO OOPHORECTOMY WITH LYSIS OF ADHESIONS;  Surgeon: Eloy Herring, MD;  Location: [facility] ;  Service: Gynecology;   Laterality: N/A;   SUPRACERVICAL ABDOMINAL HYSTERECTOMY  08-18-2003   @[facility]     Current Medications: Active Medications[1]   Allergies:   Amoxicillin   Social History   Socioeconomic History   Marital status: Single    Spouse name: Not on file   Number of children: Not on file   Years of education: Not on file   Highest education level: Not on file  Occupational History   Not on file  Tobacco Use   Smoking status: Every Day    Current packs/day: 0.50    Average packs/day: 0.5 packs/day for 20.0 years (10.0 ttl pk-yrs)    Types: Cigarettes   Smokeless tobacco: Never  Vaping Use   Vaping status: Never Used  Substance and Sexual Activity   Alcohol use: No   Drug use: Never   Sexual activity: Not on file  Other Topics Concern   Not on file  Social History Narrative   Not on file   Social Drivers of Health   Tobacco Use: High Risk (03/14/2024)   Patient History    Smoking Tobacco Use: Every Day    Smokeless Tobacco Use: Never    Passive Exposure: Not on file  Financial Resource Strain: Not on file  Food Insecurity: Not on file  Transportation Needs: Not on file  Physical Activity: Not on file  Stress: Not on file  Social Connections: Not on file  Depression (PHQ2-9): Low Risk (11/30/2023)   Depression (PHQ2-9)    PHQ-2 Score:  0  Alcohol Screen: Not on file  Housing: Not on file  Utilities: Not on file  Health Literacy: Not on file     Family History: The patient's family history includes Cancer in her mother; Diabetes in her brother and sister; Hypertension in her brother, father, mother, and sister. ROS:   Please see the history of present illness.    All 14 point review of systems negative except as described per history of present illness.  EKGs/Labs/Other Studies Reviewed:    The following studies were reviewed today:   EKG:       Recent Labs: 01/05/2024: ALT 13; BUN 7; Creatinine, Ser 0.81; Hemoglobin 9.3; Platelets 341; Potassium 3.6; Sodium 141   Recent Lipid Panel    Component Value Date/Time   CHOL 201 (H) 11/30/2023 1617   TRIG 191 (H) 11/30/2023 1617   HDL 41 (L) 11/30/2023 1617   CHOLHDL 4.9 11/30/2023 1617   LDLCALC 127 (H) 11/30/2023 1617    Physical Exam:    VS:  There were no vitals taken for this visit.    Wt Readings from Last 3 Encounters:  03/14/24 197 lb 6.4 oz (89.5 kg)  02/12/24 196 lb 1.3 oz (88.9 kg)  11/30/23 204 lb 12.8 oz (92.9 kg)     GENERAL:  Well nourished, well developed in no acute distress NECK: No JVD; No carotid bruits CARDIAC: RRR, S1 and S2 present, no murmurs, no rubs, no gallops CHEST:  Clear to auscultation without rales, wheezing or rhonchi  Extremities: No pitting pedal edema. Pulses bilaterally symmetric with radial 2+ and dorsalis pedis 2+ NEUROLOGIC:  Alert and oriented x 3  Medication Adjustments/Labs and Tests Ordered: Current medicines are reviewed at length with the patient today.  Concerns regarding medicines are outlined above.  No orders of the defined types were placed in this encounter.  No orders of the defined types were placed in this encounter.   Signed, Kosisochukwu Goldberg reddy Courtnie Brenes, MD, MPH, [facility]. 06/25/2024 4:04 PM    [facility]    [1]  No outpatient medications have been marked as taking for the 06/25/24 encounter (Appointment) with Sherline Eberwein, Alean SAUNDERS, MD.   "
